# Patient Record
Sex: Female | Born: 1980
Health system: Southern US, Community
[De-identification: ages and names within clinical notes are randomized; demographics above are authoritative.]

## PROBLEM LIST (undated history)

## (undated) DIAGNOSIS — G43909 Migraine, unspecified, not intractable, without status migrainosus: Secondary | ICD-10-CM

## (undated) DIAGNOSIS — F32A Depression, unspecified: Secondary | ICD-10-CM

## (undated) DIAGNOSIS — I10 Essential (primary) hypertension: Secondary | ICD-10-CM

## (undated) DIAGNOSIS — J329 Chronic sinusitis, unspecified: Secondary | ICD-10-CM

## (undated) DIAGNOSIS — K219 Gastro-esophageal reflux disease without esophagitis: Secondary | ICD-10-CM

## (undated) DIAGNOSIS — E7800 Pure hypercholesterolemia, unspecified: Secondary | ICD-10-CM

## (undated) DIAGNOSIS — F419 Anxiety disorder, unspecified: Secondary | ICD-10-CM

## (undated) DIAGNOSIS — F329 Major depressive disorder, single episode, unspecified: Secondary | ICD-10-CM

## (undated) HISTORY — PX: DILATION AND CURETTAGE OF UTERUS: SHX78

## (undated) HISTORY — PX: ADENOIDECTOMY: SUR15

## (undated) HISTORY — DX: Depression, unspecified: F32.A

## (undated) HISTORY — DX: Essential (primary) hypertension: I10

## (undated) HISTORY — DX: Chronic sinusitis, unspecified: J32.9

## (undated) HISTORY — DX: Anxiety disorder, unspecified: F41.9

---
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
## 2002-09-25 ENCOUNTER — Other Ambulatory Visit: Admission: RE | Admit: 2002-09-25 | Discharge: 2002-09-25 | Payer: Self-pay | Admitting: Obstetrics and Gynecology

## 2003-10-02 ENCOUNTER — Other Ambulatory Visit: Admission: RE | Admit: 2003-10-02 | Discharge: 2003-10-02 | Payer: Self-pay | Admitting: Obstetrics and Gynecology

## 2003-11-16 ENCOUNTER — Emergency Department (HOSPITAL_COMMUNITY): Admission: EM | Admit: 2003-11-16 | Discharge: 2003-11-16 | Payer: Self-pay | Admitting: Emergency Medicine

## 2004-05-05 ENCOUNTER — Emergency Department (HOSPITAL_COMMUNITY): Admission: EM | Admit: 2004-05-05 | Discharge: 2004-05-05 | Payer: Self-pay | Admitting: *Deleted

## 2013-08-21 ENCOUNTER — Emergency Department (INDEPENDENT_AMBULATORY_CARE_PROVIDER_SITE_OTHER): Payer: 59

## 2013-08-21 ENCOUNTER — Emergency Department
Admission: EM | Admit: 2013-08-21 | Discharge: 2013-08-21 | Disposition: A | Discharge status: Home / Self Care | Payer: 59 | Source: Home / Self Care | Attending: Family Medicine | Admitting: Family Medicine

## 2013-08-21 ENCOUNTER — Encounter: Payer: Self-pay | Admitting: Emergency Medicine

## 2013-08-21 DIAGNOSIS — J3489 Other specified disorders of nose and nasal sinuses: Secondary | ICD-10-CM

## 2013-08-21 DIAGNOSIS — J069 Acute upper respiratory infection, unspecified: Secondary | ICD-10-CM

## 2013-08-21 HISTORY — DX: Migraine, unspecified, not intractable, without status migrainosus: G43.909

## 2013-08-21 MED ORDER — PREDNISONE 20 MG PO TABS: 20.0000 mg | ORAL_TABLET | Freq: Two times a day (BID) | ORAL | Status: DC

## 2013-08-21 MED ORDER — BENZONATATE 200 MG PO CAPS: 200.0000 mg | ORAL_CAPSULE | Freq: Every day | ORAL | Status: DC

## 2013-08-21 MED ORDER — AMOXICILLIN 875 MG PO TABS: 875.0000 mg | ORAL_TABLET | Freq: Two times a day (BID) | ORAL | Status: DC

## 2013-08-21 NOTE — ED Notes (Signed)
Arwa c/o sinus pain, congestion and productive cough without fever x 8 days. Rec'd flu vac this season.

## 2013-08-21 NOTE — ED Provider Notes (Signed)
CSN: [id number]     Arrival date & time 08/21/13  7564 History   First MD Initiated Contact with Patient 08/21/13 1036     Chief Complaint  Patient presents with  . Sinus Problem  . Cough        HPI Comments: Patient complains of onset of sinus congestion and sore throat 8 days ago, followed by a productive cough worse at night.  She has been fatigued and has developed fullness in her left ear over the past two days. She reports a history of recurring sinus congestion that occurs every month lasting about 2 to 4 days, and has not responded in past to Nasonex. She has a past history of pneumonia several times.  The history is provided by the patient.    Past Medical History  Diagnosis Date  . Migraine    Past Surgical History  Procedure Laterality Date  . Adenoidectomy    . Leg surgery Left 1998    Plastic Surg   Family History  Problem Relation Age of Onset  . Hypertension Father    History  Substance Use Topics  . Smoking status: Never Smoker   . Smokeless tobacco: Never Used  . Alcohol Use: Yes   OB History   Grav Para Term Preterm Abortions TAB SAB Ect Mult Living                 Review of Systems + sore throat + cough + hoarseness No pleuritic pain No wheezing + nasal congestion + post-nasal drainage + sinus pain/pressure No itchy/red eyes ? left earache No hemoptysis No SOB No fever/chills No nausea No vomiting No abdominal pain No diarrhea No urinary symptoms No skin rash + fatigue No myalgias + headache Used OTC meds without relief    Allergies  Review of patient's allergies indicates no known allergies.  Home Medications   Current Outpatient Rx  Name  Route  Sig  Dispense  Refill  . amoxicillin (AMOXIL) 875 MG tablet   Oral   Take 1 tablet (875 mg total) by mouth 2 (two) times daily. (Rx void after 08/29/13)   20 tablet   0   . benzonatate (TESSALON) 200 MG capsule   Oral   Take 1 capsule (200 mg total) by mouth at bedtime. Take  as needed for cough   12 capsule   0   . predniSONE (DELTASONE) 20 MG tablet   Oral   Take 1 tablet (20 mg total) by mouth 2 (two) times daily. Take with food.   10 tablet   0    BP 136/87  Pulse 72  Temp(Src) 98.2 F (36.8 C) (Oral)  Resp 14  Ht 5\' 8"  (1.727 m)  Wt 232 lb (105.235 kg)  BMI 35.28 kg/m2  SpO2 100% Physical Exam Nursing notes and Vital Signs reviewed. Appearance:  Patient appears stated age, and in no acute distress.  Patient is obese (BMI 35.3) Eyes:  Pupils are equal, round, and reactive to light and accomodation.  Extraocular movement is intact.  Conjunctivae are not inflamed  Ears:  Canals normal.  Tympanic membranes normal.  Nose:  Mildly congested turbinates.  No sinus tenderness.   Pharynx:  Normal Neck:  Supple.  Bilateral enlarged posterior nodes, tender on the left. Lungs:  Clear to auscultation.  Breath sounds are equal.  Heart:  Regular rate and rhythm without murmurs, rubs, or gallops.  Abdomen:  Nontender without masses or hepatosplenomegaly.  Bowel sounds are present.  No CVA or  flank tenderness.  Extremities:  No edema.  No calf tenderness Skin:  No rash present.   ED Course  Procedures  none    Imaging Review Dg Sinuses Complete  08/21/2013   CLINICAL DATA:  33 year old female with sinus congestion, increased. Initial encounter.  EXAM: PARANASAL SINUSES - COMPLETE 3 + VIEW  COMPARISON:  None.  FINDINGS: Bone mineralization is within normal limits. Maxillary sinus pneumatization within normal limits. Frontal sinuses likewise appear clear. Ethmoid and sphenoid sinuses appear within normal limits. Symmetric appearing mastoid pneumatization.  IMPRESSION: Negative radiographic sinus series.   Electronically Signed   By: Lars Pinks M.D.   On: 08/21/2013 11:32      MDM   Final diagnoses:  Acute upper respiratory infections of unspecified site; suspect viral URI    There is no evidence of bacterial infection today. Treat symptomatically for  now: Prescription written for Benzonatate [facility]) to take at bedtime for night-time cough.  Prednisone burst. Take plain Mucinex (1200 mg guaifenesin) twice daily for cough and congestion.  May add Sudafed for sinus congestion.   Increase fluid intake, rest. May use Afrin nasal spray (or generic oxymetazoline) twice daily for about 5 days.  Also recommend using saline nasal spray several times daily and saline nasal irrigation (AYR is a common brand) Stop all antihistamines for now, and other non-prescription cough/cold preparations. Begin Amoxicillin if not improving about 5 days or if persistent fever develops (Given a prescription to hold, with an expiration date)  Follow-up with family doctor if not improving 7 to 10 days.     Kandra Nicolas, MD 08/23/13 [phone]

## 2013-08-21 NOTE — Discharge Instructions (Signed)
Take plain Mucinex (1200 mg guaifenesin) twice daily for cough and congestion.  May add Sudafed for sinus congestion.   Increase fluid intake, rest. May use Afrin nasal spray (or generic oxymetazoline) twice daily for about 5 days.  Also recommend using saline nasal spray several times daily and saline nasal irrigation (AYR is a common brand) Stop all antihistamines for now, and other non-prescription cough/cold preparations. Begin Amoxicillin if not improving about 5 days or if persistent fever develops   Follow-up with family doctor if not improving 7 to 10 days.

## 2013-08-27 ENCOUNTER — Telehealth: Payer: Self-pay

## 2013-08-27 NOTE — ED Notes (Signed)
I called and spoke with patient and she is doing some better. I advised to call back if anything changes or if she has questions or concerns.

## 2015-06-30 NOTE — L&D Delivery Note (Signed)
Delivery Note Pt reached complete dilation and pushed great. At 1:55 PM a viable female was delivered via Vaginal, Spontaneous Delivery (Presentation: Left Occiput Anterior).  APGAR: 9, 9; weight pending  .   Placenta status: Intact, Spontaneous.  Cord: 3 vessels with the following complications: None.    Anesthesia: Epidural  Episiotomy: None Lacerations: 2nd degree Suture Repair: 2.0 3.0 vicryl rapide Est. Blood Loss (mL):  225cc  Mom to postpartum.  Baby to Couplet care / Skin to Skin. D/w pt circumcision and they desire to proceed.  Logan Bores 12/31/2015, 2:25 PM

## 2015-07-04 DIAGNOSIS — Z3A13 13 weeks gestation of pregnancy: Secondary | ICD-10-CM | POA: Diagnosis not present

## 2015-07-04 DIAGNOSIS — O358XX0 Maternal care for other (suspected) fetal abnormality and damage, not applicable or unspecified: Secondary | ICD-10-CM | POA: Diagnosis not present

## 2015-07-04 DIAGNOSIS — Z3481 Encounter for supervision of other normal pregnancy, first trimester: Secondary | ICD-10-CM | POA: Diagnosis not present

## 2015-08-05 ENCOUNTER — Inpatient Hospital Stay (HOSPITAL_COMMUNITY)
Admission: AD | Admit: 2015-08-05 | Discharge: 2015-08-05 | Disposition: A | Discharge status: Home / Self Care | Payer: 59 | Source: Ambulatory Visit | Attending: Obstetrics and Gynecology | Admitting: Obstetrics and Gynecology

## 2015-08-05 ENCOUNTER — Encounter (HOSPITAL_COMMUNITY): Payer: Self-pay | Admitting: *Deleted

## 2015-08-05 DIAGNOSIS — O9989 Other specified diseases and conditions complicating pregnancy, childbirth and the puerperium: Secondary | ICD-10-CM

## 2015-08-05 DIAGNOSIS — R0602 Shortness of breath: Secondary | ICD-10-CM

## 2015-08-05 DIAGNOSIS — Z3492 Encounter for supervision of normal pregnancy, unspecified, second trimester: Secondary | ICD-10-CM

## 2015-08-05 DIAGNOSIS — Z3A17 17 weeks gestation of pregnancy: Secondary | ICD-10-CM | POA: Insufficient documentation

## 2015-08-05 DIAGNOSIS — O26892 Other specified pregnancy related conditions, second trimester: Secondary | ICD-10-CM | POA: Diagnosis not present

## 2015-08-05 NOTE — MAU Provider Note (Signed)
History     CSN: [id number]  Arrival date and time: 08/05/15 1318   None     Chief Complaint  Patient presents with  . Shortness of Breath   HPI Mackenzie Holland is 35 y.o. [id number] 17w gestation presenting with 4 day history of shortness of breath.  She is a patient of Dr. Nena Alexander.  Began Friday at work, felt "winded"-busy day, moving transport beds.  Took Benadryl and rested when she got home --unable to sleep well.  Saturday was the same but yesterday afternoon after going to [employer], lower back hurt-"like I needed to stretch out by back"  Then she was unable to take a deep breath, needing to take a deep yawn to get air into lungs.  Decreased appetite.  Last night rested propped up all night, unable to rest.  Even sheet on her was "too heavy" on her chest.  Called office today and instructed to come here.  She reports heart palpitations during the pregnancy with exertion, reported in the past to Dr. Marvel Plan.           Past Medical History  Diagnosis Date  . Migraine     Past Surgical History  Procedure Laterality Date  . Adenoidectomy    . Leg surgery Left 1998    Plastic Surg  . Dilation and curettage of uterus      Family History  Problem Relation Age of Onset  . Hypertension Father     Social History  Substance Use Topics  . Smoking status: Never Smoker   . Smokeless tobacco: Never Used  . Alcohol Use: Yes    Allergies: No Known Allergies  Prescriptions prior to admission  Medication Sig Dispense Refill Last Dose  . amoxicillin (AMOXIL) 875 MG tablet Take 1 tablet (875 mg total) by mouth 2 (two) times daily. (Rx void after 08/29/13) 20 tablet 0   . benzonatate (TESSALON) 200 MG capsule Take 1 capsule (200 mg total) by mouth at bedtime. Take as needed for cough 12 capsule 0   . predniSONE (DELTASONE) 20 MG tablet Take 1 tablet (20 mg total) by mouth 2 (two) times daily. Take with food. 10 tablet 0     Review of Systems  Constitutional: Negative for  fever and chills.  HENT:       Nosebleeds daily that began at [redacted] weeks gestation.  Respiratory: Positive for shortness of breath. Negative for cough (today, took tylenol today, better), sputum production and wheezing.   Cardiovascular: Positive for palpitations (intermittent with activity) and orthopnea. Negative for chest pain and leg swelling.  Gastrointestinal: Positive for nausea (intermittent throughout pregnancy). Negative for vomiting, abdominal pain, diarrhea and constipation.  Genitourinary: Negative for dysuria, urgency, frequency and hematuria.       Neg for vaginal bleeding.  Neurological: Positive for headaches. Negative for weakness.   Physical Exam   Blood pressure 150/78, pulse 73, temperature 98 F (36.7 C), temperature source Oral, resp. rate 18.  Physical Exam  Constitutional: She is oriented to person, place, and time. She appears well-developed and well-nourished. No distress.  HENT:  Head: Normocephalic.  Neck: Normal range of motion. Neck supple.  Cardiovascular: Normal rate, regular rhythm and normal heart sounds.   Respiratory: Effort normal and breath sounds normal. No respiratory distress. She has no wheezes. She has no rales. She exhibits no tenderness.  GI: Soft. There is no tenderness.  Genitourinary: No bleeding in the vagina.   not indicated  Musculoskeletal: Normal range of motion. She  exhibits no edema.  Neurological: She is alert and oriented to person, place, and time.  Skin: Skin is warm and dry.  Psychiatric: She has a normal mood and affect. Her behavior is normal. Thought content normal.   Fetal heart rate 142   MAU Course  Procedures  MDM MSE Exam EKG- NSR, borderline ECG-nod voltage criteria for LVH, may be normal variant.           Reported to Dr. Marvel Plan -may discharge patient  14:05 Discussed patient's hx, MSE, physical findings and VS with Dr. Marvel Plan.  Order given for EKG.  O2 Sats 100% at discharge.   Assessment and Plan   A:  Shortness of breath      [redacted] weeks gestation  P:  Discharge to home      Call Dr. Marvel Plan for increase in sxs      Keep scheduled appts in the office   KEY,EVE M 08/05/2015, 1:42 PM

## 2015-08-05 NOTE — Discharge Instructions (Signed)

## 2015-08-05 NOTE — MAU Note (Signed)
C/o SOB since Friday (4 days ago);

## 2015-08-15 ENCOUNTER — Ambulatory Visit: Payer: 59 | Admitting: Physician Assistant

## 2015-08-20 ENCOUNTER — Emergency Department (INDEPENDENT_AMBULATORY_CARE_PROVIDER_SITE_OTHER)
Admission: EM | Admit: 2015-08-20 | Discharge: 2015-08-20 | Disposition: A | Discharge status: Home / Self Care | Payer: 59 | Source: Home / Self Care | Attending: Family Medicine | Admitting: Family Medicine

## 2015-08-20 ENCOUNTER — Encounter: Payer: Self-pay | Admitting: *Deleted

## 2015-08-20 DIAGNOSIS — R059 Cough, unspecified: Secondary | ICD-10-CM

## 2015-08-20 DIAGNOSIS — R05 Cough: Secondary | ICD-10-CM

## 2015-08-20 DIAGNOSIS — Z0372 Encounter for suspected placental problem ruled out: Secondary | ICD-10-CM | POA: Diagnosis not present

## 2015-08-20 DIAGNOSIS — J069 Acute upper respiratory infection, unspecified: Secondary | ICD-10-CM | POA: Diagnosis not present

## 2015-08-20 DIAGNOSIS — Z36 Encounter for antenatal screening of mother: Secondary | ICD-10-CM | POA: Diagnosis not present

## 2015-08-20 DIAGNOSIS — Z3A19 19 weeks gestation of pregnancy: Secondary | ICD-10-CM | POA: Diagnosis not present

## 2015-08-20 MED ORDER — AZITHROMYCIN 250 MG PO TABS: 250.0000 mg | ORAL_TABLET | Freq: Every day | ORAL | Status: DC

## 2015-08-20 NOTE — ED Notes (Signed)
Pt c/o cough, productive at times x 4 days. Started Tamiflu 5 days ago for flu-like symptoms, written by OB. Currently [redacted]W[redacted]d gestation. Taking Delsym for daytime but cannot sleep at night due to cough. Afebrile.

## 2015-08-20 NOTE — Discharge Instructions (Signed)
Cool Mist Vaporizers  Vaporizers may help relieve the symptoms of a cough and cold. They add moisture to the air, which helps mucus to become thinner and less sticky. This makes it easier to breathe and cough up secretions. Cool mist vaporizers do not cause serious burns like hot mist vaporizers, which may also be called steamers or humidifiers. Vaporizers have not been proven to help with colds. You should not use a vaporizer if you are allergic to mold.  HOME CARE INSTRUCTIONS  · Follow the package instructions for the vaporizer.  · Do not use anything other than distilled water in the vaporizer.  · Do not run the vaporizer all of the time. This can cause mold or bacteria to grow in the vaporizer.  · Clean the vaporizer after each time it is used.  · Clean and dry the vaporizer well before storing it.  · Stop using the vaporizer if worsening respiratory symptoms develop.     This information is not intended to replace advice given to you by your health care provider. Make sure you discuss any questions you have with your health care provider.     Document Released: 03/12/2004 Document Revised: 06/20/2013 Document Reviewed: 11/02/2012  Elsevier Interactive Patient Education ©2016 [employer] Inc[employer]

## 2015-08-20 NOTE — ED Provider Notes (Signed)
CSN: [id number]     Arrival date & time 08/20/15  1604 History   First MD Initiated Contact with Patient 08/20/15 1652     Chief Complaint  Patient presents with  . Cough   (Consider location/radiation/quality/duration/timing/severity/associated sxs/prior Treatment) HPI  The pt is a 35yo female, 19 weeks 6 days pregnant, presenting to [facility] with c/o 4 days of mild to moderately productive cough that is worse at night making it difficult for pt to sleep.  She was seen by her OB/GYN initially and started on a 5 day course of Tamiflu. She has been taking as prescribed as well as Delsym but no relief of cough. Her OB/GYN referred her to a "general practitioner" for further evaluation.  Denies hx of asthma. Denies fever, chills, n/v/d. She did have a fever initially but that has resolved as well as body aches.   Past Medical History  Diagnosis Date  . Migraine    Past Surgical History  Procedure Laterality Date  . Adenoidectomy    . Leg surgery Left 1998    Plastic Surg  . Dilation and curettage of uterus     Family History  Problem Relation Age of Onset  . Hypertension Father    Social History  Substance Use Topics  . Smoking status: Never Smoker   . Smokeless tobacco: Never Used  . Alcohol Use: No   OB History    Gravida Para Term Preterm AB TAB SAB Ectopic Multiple Living   3 2 2       2      Review of Systems  Constitutional: Negative for fever and chills.  HENT: Positive for congestion. Negative for ear pain, sore throat, trouble swallowing and voice change.   Respiratory: Positive for cough. Negative for shortness of breath.   Cardiovascular: Negative for chest pain and palpitations.  Gastrointestinal: Negative for nausea, vomiting, abdominal pain and diarrhea.  Musculoskeletal: Negative for myalgias, back pain and arthralgias.  Skin: Negative for rash.    Allergies  Review of patient's allergies indicates no known allergies.  Home Medications   Prior to Admission  medications   Medication Sig Start Date End Date Taking? Authorizing Provider  Oseltamivir Phosphate (TAMIFLU PO) Take by mouth.   Yes Historical Provider, MD  acetaminophen (TYLENOL) 500 MG tablet Take 500 mg by mouth every 6 (six) hours as needed for moderate pain.    Historical Provider, MD  azithromycin (ZITHROMAX) 250 MG tablet Take 1 tablet (250 mg total) by mouth daily. Take first 2 tablets together, then 1 every day until finished. 08/20/15   Noland Fordyce, PA-C  famotidine (PEPCID) 20 MG tablet Take 20 mg by mouth 2 (two) times daily.    Historical Provider, MD  Prenatal Vit-Fe Fumarate-FA (PRENATAL MULTIVITAMIN) TABS tablet Take 1 tablet by mouth daily at 12 noon.    Historical Provider, MD   Meds Ordered and Administered this Visit  Medications - No data to display  BP 145/88 mmHg  Pulse 85  Temp(Src) 98.2 F (36.8 C) (Oral)  Resp 14  Wt 231 lb (104.781 kg)  SpO2 100% No data found.   Physical Exam  Constitutional: She appears well-developed and well-nourished. No distress.  HENT:  Head: Normocephalic and atraumatic.  Right Ear: Tympanic membrane normal.  Left Ear: Tympanic membrane normal.  Nose: Rhinorrhea present.  Mouth/Throat: Uvula is midline, oropharynx is clear and moist and mucous membranes are normal.  Eyes: Conjunctivae are normal. No scleral icterus.  Neck: Normal range of motion. Neck supple.  Cardiovascular: Normal rate, regular rhythm and normal heart sounds.   Pulmonary/Chest: Effort normal and breath sounds normal. No respiratory distress. She has no wheezes. She has no rales. She exhibits no tenderness.  Abdominal: Soft. She exhibits no distension. There is no tenderness.  Musculoskeletal: Normal range of motion.  Neurological: She is alert.  Skin: Skin is warm and dry. She is not diaphoretic.  Nursing note and vitals reviewed.   ED Course  Procedures (including critical care time)  Labs Review Labs Reviewed - No data to display  Imaging  Review No results found.    MDM   1. Acute upper respiratory infection   2. Cough    Pt c/o persistent cough that keeps her up at night. Fatigue, body aches and fever she initially had have resolved after she started taking Tamiflu prescribed by her OB/GYN.  Will place pt on Azithromycin to cover for atypical bacteria.   Encouraged to use humidifier at night, stay well hydrated and try sinus rinses for cough as Tessalon and cough medication with codeine is pregnancy risk category 'C' Advised pt she may discuss stronger prescription cough medication with her OB/GYN now that is has been seen for cough and started on antibiotics.  Patient verbalized understanding and agreement with treatment plan.    Noland Fordyce, PA-C 08/20/15 1739

## 2015-09-19 DIAGNOSIS — Z3A24 24 weeks gestation of pregnancy: Secondary | ICD-10-CM | POA: Diagnosis not present

## 2015-09-19 DIAGNOSIS — Z3482 Encounter for supervision of other normal pregnancy, second trimester: Secondary | ICD-10-CM | POA: Diagnosis not present

## 2015-09-26 ENCOUNTER — Ambulatory Visit (INDEPENDENT_AMBULATORY_CARE_PROVIDER_SITE_OTHER): Payer: 59 | Admitting: Cardiology

## 2015-09-26 ENCOUNTER — Other Ambulatory Visit (HOSPITAL_COMMUNITY): Payer: 59

## 2015-09-26 ENCOUNTER — Encounter: Payer: Self-pay | Admitting: Cardiology

## 2015-09-26 VITALS — BP 122/84 | HR 80 | Ht 68.0 in | Wt 237.4 lb

## 2015-09-26 DIAGNOSIS — R0602 Shortness of breath: Secondary | ICD-10-CM | POA: Diagnosis not present

## 2015-09-26 DIAGNOSIS — R0600 Dyspnea, unspecified: Secondary | ICD-10-CM | POA: Diagnosis not present

## 2015-09-26 NOTE — Progress Notes (Signed)
Cardiology Office Note NEW PATIENT  Date:  09/26/2015   ID:  IFFANY MCADAM, DOB 1981/02/14, MRN [id number]  PCP:  No primary care provider on file.  Cardiologist:  NEW  Dr. Acie Fredrickson    Chief Complaint  Patient presents with  . Shortness of Breath    wakes from sleep at night, + skipped heart beats, DOE.       History of Present Illness: TERENCE QUASHIE is a 35 y.o. female who presents for SOB that wakes her from sleep.  Also skipped beats, and DOE.  She is 25 weeks 1 day preg. 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   Symptoms began in Feb.  She felt tight all over and was SOB at rest.  This continued for 2 full days so went to MAU clinic at [facility] and was found stable.  She then developed the Flu and then URI.  She is now better overall with some SOB left.  She is unable to take stairs at work due to SOB at home ok walking up one flight of stairs.   She is resting better as well.  She has only gained 4 lbs this pregnancy.  She did have a lot of nausea initially.  This is a boy and other 2 are girls.  No chest pain.    Past Medical History  Diagnosis Date  . Migraine     Past Surgical History  Procedure Laterality Date  . Adenoidectomy    . Leg surgery Left 1998    Plastic Surg  . Dilation and curettage of uterus       Current Outpatient Prescriptions  Medication Sig Dispense Refill  . acetaminophen (TYLENOL) 500 MG tablet Take 500 mg by mouth every 6 (six) hours as needed for moderate pain.    . famotidine (PEPCID) 20 MG tablet Take 20 mg by mouth 2 (two) times daily.    . Prenatal Vit-Fe Fumarate-FA (PRENATAL MULTIVITAMIN) TABS tablet Take 1 tablet by mouth daily at 12 noon.     No current facility-administered medications for this visit.    Allergies:   Review of patient's allergies indicates no known allergies.    Social History:  The patient  reports that she has never smoked. She has never used smokeless tobacco. She reports that she does not drink alcohol or use  illicit drugs.   Family History:  The patient's family history includes Hypertension in her father.    ROS:  General:no colds or fevers, 4 lb weight gain Skin:no rashes or ulcers HEENT:no blurred vision, no congestion CV:see HPI PUL:see HPI GI:no diarrhea constipation or melena, no indigestion GU:no hematuria, no dysuria MS:no joint pain, no claudication Neuro:no syncope, no lightheadedness Endo:no diabetes, no thyroid disease  Wt Readings from Last 3 Encounters:  09/26/15 237 lb 6.4 oz (107.684 kg)  08/20/15 231 lb (104.781 kg)  08/21/13 232 lb (105.235 kg)     PHYSICAL EXAM: VS:  BP 122/84 mmHg  Pulse 80  Ht 5\' 8"  (1.727 m)  Wt 237 lb 6.4 oz (107.684 kg)  BMI 36.10 kg/m2 , BMI Body mass index is 36.1 kg/(m^2). General:Pleasant affect, NAD Skin:Warm and dry, brisk capillary refill HEENT:normocephalic, sclera clear, mucus membranes moist Neck:supple, no JVD, no bruits  Heart:S1S2 RRR with soft flow murmur, no gallup, rub or click Lungs:clear without rales, rhonchi, or wheezes [id number], non tender, + BS, do not palpate liver spleen or masses Ext:no lower ext edema, 2+ pedal pulses, 2+ radial pulses Neuro:alert and  oriented X 3, MAE, follows commands, + facial symmetry    EKG:  EKG is ordered today. The ekg ordered today demonstrates SR mod LVH. No changes from previous tracings.    Recent Labs: No results found for requested labs within last 365 days.    Lipid Panel No results found for: CHOL, TRIG, HDL, CHOLHDL, VLDL, LDLCALC, LDLDIRECT     Other studies Reviewed: Additional studies/ records that were reviewed today include: office notes from OB.   ASSESSMENT AND PLAN:  1.  SOB- Dr. Acie Fredrickson has seen also.  Will check an Echo to evaluate but he felt this is due to volume.  She was reassured but she will call if further problems.  We will follow PRN.    Current medicines are reviewed with the patient today.  The patient Has no concerns regarding  medicines.  The following changes have been made:  See above Labs/ tests ordered today include:see above  Disposition:   FU:  see above  Signed, Isaiah Serge, NP  09/26/2015 1:39 PM    [facility] [place], [place], [address] [place], [place] Phone: [phone]; Fax: [phone]  Attending Note:   The patient was seen and examined.  Agree with assessment and plan as noted above.  Changes made to the above note as needed.  Kimley is seen today .   C/o dyspnea but certainly is in no distress today . No evidence that this is Pulmonary embolus I suspect it is related to volume expansion with pregnancy. Will get an echo and follow up with me as needed.    Thayer Headings, Brooke Bonito., MD, [facility] 09/26/2015, 6:17 PM [address],  [place] Pager [phone]

## 2015-09-26 NOTE — Patient Instructions (Signed)
Medication Instructions:  Your physician recommends that you continue on your current medications as directed. Please refer to the Current Medication list given to you today.   Labwork: -NONE  Testing/Procedures: Your physician has requested that you have an echocardiogram. Echocardiography is a painless test that uses sound waves to create images of your heart. It provides your doctor with information about the size and shape of your heart and how well your heart's chambers and valves are working. This procedure takes approximately one hour. There are no restrictions for this procedure.    Follow-Up: AS NEEDED  Any Other Special Instructions Will Be Listed Below (If Applicable).     If you need a refill on your cardiac medications before your next appointment, please call your pharmacy.

## 2015-10-15 ENCOUNTER — Ambulatory Visit (HOSPITAL_COMMUNITY): Payer: 59 | Attending: Internal Medicine

## 2015-10-15 ENCOUNTER — Other Ambulatory Visit: Payer: Self-pay

## 2015-10-15 DIAGNOSIS — R0600 Dyspnea, unspecified: Secondary | ICD-10-CM | POA: Insufficient documentation

## 2015-10-15 DIAGNOSIS — I071 Rheumatic tricuspid insufficiency: Secondary | ICD-10-CM | POA: Insufficient documentation

## 2015-10-18 DIAGNOSIS — Z3483 Encounter for supervision of other normal pregnancy, third trimester: Secondary | ICD-10-CM | POA: Diagnosis not present

## 2015-10-18 DIAGNOSIS — Z23 Encounter for immunization: Secondary | ICD-10-CM | POA: Diagnosis not present

## 2015-10-18 DIAGNOSIS — Z36 Encounter for antenatal screening of mother: Secondary | ICD-10-CM | POA: Diagnosis not present

## 2015-10-18 DIAGNOSIS — Z3A28 28 weeks gestation of pregnancy: Secondary | ICD-10-CM | POA: Diagnosis not present

## 2015-10-31 DIAGNOSIS — Z3483 Encounter for supervision of other normal pregnancy, third trimester: Secondary | ICD-10-CM | POA: Diagnosis not present

## 2015-10-31 DIAGNOSIS — Z3A30 30 weeks gestation of pregnancy: Secondary | ICD-10-CM | POA: Diagnosis not present

## 2015-12-03 DIAGNOSIS — O289 Unspecified abnormal findings on antenatal screening of mother: Secondary | ICD-10-CM | POA: Diagnosis not present

## 2015-12-03 DIAGNOSIS — Z3A34 34 weeks gestation of pregnancy: Secondary | ICD-10-CM | POA: Diagnosis not present

## 2015-12-03 DIAGNOSIS — O3663X0 Maternal care for excessive fetal growth, third trimester, not applicable or unspecified: Secondary | ICD-10-CM | POA: Diagnosis not present

## 2015-12-11 DIAGNOSIS — Z36 Encounter for antenatal screening of mother: Secondary | ICD-10-CM | POA: Diagnosis not present

## 2015-12-11 DIAGNOSIS — Z3A36 36 weeks gestation of pregnancy: Secondary | ICD-10-CM | POA: Diagnosis not present

## 2015-12-11 DIAGNOSIS — O403XX0 Polyhydramnios, third trimester, not applicable or unspecified: Secondary | ICD-10-CM | POA: Diagnosis not present

## 2015-12-13 DIAGNOSIS — Z3A36 36 weeks gestation of pregnancy: Secondary | ICD-10-CM | POA: Diagnosis not present

## 2015-12-13 DIAGNOSIS — O403XX0 Polyhydramnios, third trimester, not applicable or unspecified: Secondary | ICD-10-CM | POA: Diagnosis not present

## 2015-12-17 DIAGNOSIS — Z3A36 36 weeks gestation of pregnancy: Secondary | ICD-10-CM | POA: Diagnosis not present

## 2015-12-17 DIAGNOSIS — O403XX0 Polyhydramnios, third trimester, not applicable or unspecified: Secondary | ICD-10-CM | POA: Diagnosis not present

## 2015-12-17 DIAGNOSIS — O139 Gestational [pregnancy-induced] hypertension without significant proteinuria, unspecified trimester: Secondary | ICD-10-CM | POA: Diagnosis not present

## 2015-12-20 DIAGNOSIS — O403XX0 Polyhydramnios, third trimester, not applicable or unspecified: Secondary | ICD-10-CM | POA: Diagnosis not present

## 2015-12-20 DIAGNOSIS — Z3A37 37 weeks gestation of pregnancy: Secondary | ICD-10-CM | POA: Diagnosis not present

## 2015-12-24 DIAGNOSIS — Z3A37 37 weeks gestation of pregnancy: Secondary | ICD-10-CM | POA: Diagnosis not present

## 2015-12-24 DIAGNOSIS — O403XX0 Polyhydramnios, third trimester, not applicable or unspecified: Secondary | ICD-10-CM | POA: Diagnosis not present

## 2015-12-27 ENCOUNTER — Inpatient Hospital Stay (HOSPITAL_COMMUNITY)
Admission: AD | Admit: 2015-12-27 | Discharge: 2015-12-27 | Disposition: A | Discharge status: Home / Self Care | Payer: 59 | Source: Ambulatory Visit | Attending: Obstetrics and Gynecology | Admitting: Obstetrics and Gynecology

## 2015-12-27 ENCOUNTER — Encounter (HOSPITAL_COMMUNITY): Payer: Self-pay | Admitting: *Deleted

## 2015-12-27 DIAGNOSIS — O26893 Other specified pregnancy related conditions, third trimester: Secondary | ICD-10-CM | POA: Insufficient documentation

## 2015-12-27 DIAGNOSIS — R030 Elevated blood-pressure reading, without diagnosis of hypertension: Secondary | ICD-10-CM | POA: Insufficient documentation

## 2015-12-27 DIAGNOSIS — Z3A38 38 weeks gestation of pregnancy: Secondary | ICD-10-CM | POA: Diagnosis not present

## 2015-12-27 DIAGNOSIS — O403XX0 Polyhydramnios, third trimester, not applicable or unspecified: Secondary | ICD-10-CM | POA: Diagnosis not present

## 2015-12-27 HISTORY — DX: Gastro-esophageal reflux disease without esophagitis: K21.9

## 2015-12-27 LAB — COMPREHENSIVE METABOLIC PANEL
ALT: 22 U/L (ref 14–54)
AST: 21 U/L (ref 15–41)
Albumin: 3.4 g/dL — ABNORMAL LOW (ref 3.5–5.0)
Alkaline Phosphatase: 114 U/L (ref 38–126)
Anion gap: 9 (ref 5–15)
BUN: 6 mg/dL (ref 6–20)
CO2: 24 mmol/L (ref 22–32)
Calcium: 9.2 mg/dL (ref 8.9–10.3)
Chloride: 102 mmol/L (ref 101–111)
Creatinine, Ser: 0.83 mg/dL (ref 0.44–1.00)
GFR calc Af Amer: >60 mL/min (ref >60)
GFR calc non Af Amer: >60 mL/min (ref >60)
Glucose, Bld: 102 mg/dL — ABNORMAL HIGH (ref 65–99)
Potassium: 3.4 mmol/L — ABNORMAL LOW (ref 3.5–5.1)
Sodium: 135 mmol/L (ref 135–145)
Total Bilirubin: 0.5 mg/dL (ref 0.3–1.2)
Total Protein: 7.7 g/dL (ref 6.5–8.1)

## 2015-12-27 LAB — PROTEIN / CREATININE RATIO, URINE
Creatinine, Urine: 152 mg/dL
Protein Creatinine Ratio: 0.18 mg/mg{Cre} — ABNORMAL HIGH (ref 0.00–0.15)
Total Protein, Urine: 27 mg/dL

## 2015-12-27 LAB — CBC
HCT: 38.8 % (ref 36.0–46.0)
Hemoglobin: 12.9 g/dL (ref 12.0–15.0)
MCH: 27.9 pg (ref 26.0–34.0)
MCHC: 33.2 g/dL (ref 30.0–36.0)
MCV: 84 fL (ref 78.0–100.0)
Platelets: 218 10*3/uL (ref 150–400)
RBC: 4.62 MIL/uL (ref 3.87–5.11)
RDW: 14.2 % (ref 11.5–15.5)
WBC: 17.7 10*3/uL — ABNORMAL HIGH (ref 4.0–10.5)

## 2015-12-27 NOTE — Discharge Instructions (Signed)
Braxton Hicks Contractions °Contractions of the uterus can occur throughout pregnancy. Contractions are not always a sign that you are in labor.  °WHAT ARE BRAXTON HICKS CONTRACTIONS?  °Contractions that occur before labor are called Braxton Hicks contractions, or false labor. Toward the end of pregnancy (32-34 weeks), these contractions can develop more often and may become more forceful. This is not true labor because these contractions do not result in opening (dilatation) and thinning of the cervix. They are sometimes difficult to tell apart from true labor because these contractions can be forceful and people have different pain tolerances. You should not feel embarrassed if you go to the hospital with false labor. Sometimes, the only way to tell if you are in true labor is for your health care provider to look for changes in the cervix. °If there are no prenatal problems or other health problems associated with the pregnancy, it is completely safe to be sent home with false labor and await the onset of true labor. °HOW CAN YOU TELL THE DIFFERENCE BETWEEN TRUE AND FALSE LABOR? °False Labor °· The contractions of false labor are usually shorter and not as hard as those of true labor.   °· The contractions are usually irregular.   °· The contractions are often felt in the front of the lower abdomen and in the groin.   °· The contractions may go away when you walk around or change positions while lying down.   °· The contractions get weaker and are shorter lasting as time goes on.   °· The contractions do not usually become progressively stronger, regular, and closer together as with true labor.   °True Labor °· Contractions in true labor last 30-70 seconds, become very regular, usually become more intense, and increase in frequency.   °· The contractions do not go away with walking.   °· The discomfort is usually felt in the top of the uterus and spreads to the lower abdomen and low back.   °· True labor can be  determined by your health care provider with an exam. This will show that the cervix is dilating and getting thinner.   °WHAT TO REMEMBER °· Keep up with your usual exercises and follow other instructions given by your health care provider.   °· Take medicines as directed by your health care provider.   °· Keep your regular prenatal appointments.   °· Eat and drink lightly if you think you are going into labor.   °· If Braxton Hicks contractions are making you uncomfortable:   °¨ Change your position from lying down or resting to walking, or from walking to resting.   °¨ Sit and rest in a tub of warm water.   °¨ Drink 2-3 glasses of water. Dehydration may cause these contractions.   °¨ Do slow and deep breathing several times an hour.   °WHEN SHOULD I SEEK IMMEDIATE MEDICAL CARE? °Seek immediate medical care if: °· Your contractions become stronger, more regular, and closer together.   °· You have fluid leaking or gushing from your vagina.   °· You have a fever.   °· You pass blood-tinged mucus.   °· You have vaginal bleeding.   °· You have continuous abdominal pain.   °· You have low back pain that you never had before.   °· You feel your baby's head pushing down and causing pelvic pressure.   °· Your baby is not moving as much as it used to.   °  °This information is not intended to replace advice given to you by your health care provider. Make sure you discuss any questions you have with your health care   provider. °  °Document Released: 06/15/2005 Document Revised: 06/20/2013 Document Reviewed: 03/27/2013 °Elsevier Interactive Patient Education ©2016 [employer] ° °

## 2015-12-27 NOTE — MAU Provider Note (Signed)
Pt is [redacted]w[redacted]d pregnant G3P2002 who was sent from the office dilated 5 cm and BP elevated. Pt is here for pre-eclampsia labs. Pt is stable without headaches, nausea, vomiting, vision changes.  Pt has some edema in hands and feet.   Pt having ctx every 5 to 6 minutes Baseline 135 bpm with variability 6-25 BPM with 15x15 accelerations and no decelerations. Pt turned over to RN for labor evaluation and report to Dr. Willis Modena. [place], NP

## 2015-12-27 NOTE — MAU Note (Signed)
Sent from the office, contracting; 5-6 cm

## 2015-12-30 DIAGNOSIS — O403XX0 Polyhydramnios, third trimester, not applicable or unspecified: Secondary | ICD-10-CM | POA: Diagnosis not present

## 2015-12-30 DIAGNOSIS — Z3A38 38 weeks gestation of pregnancy: Secondary | ICD-10-CM | POA: Diagnosis not present

## 2015-12-30 DIAGNOSIS — O133 Gestational [pregnancy-induced] hypertension without significant proteinuria, third trimester: Secondary | ICD-10-CM | POA: Diagnosis not present

## 2015-12-31 ENCOUNTER — Inpatient Hospital Stay (HOSPITAL_COMMUNITY)
Admission: AD | Admit: 2015-12-31 | Discharge: 2016-01-02 | DRG: 775 | Disposition: A | Discharge status: Home / Self Care | Payer: 59 | Source: Ambulatory Visit | Attending: Obstetrics and Gynecology | Admitting: Obstetrics and Gynecology

## 2015-12-31 ENCOUNTER — Inpatient Hospital Stay: Admit: 2015-12-31 | Payer: Self-pay | Admitting: Obstetrics and Gynecology

## 2015-12-31 ENCOUNTER — Inpatient Hospital Stay (HOSPITAL_COMMUNITY): Payer: 59 | Admitting: Anesthesiology

## 2015-12-31 ENCOUNTER — Encounter (HOSPITAL_COMMUNITY): Payer: Self-pay

## 2015-12-31 DIAGNOSIS — O9962 Diseases of the digestive system complicating childbirth: Secondary | ICD-10-CM | POA: Diagnosis present

## 2015-12-31 DIAGNOSIS — Z6837 Body mass index (BMI) 37.0-37.9, adult: Secondary | ICD-10-CM

## 2015-12-31 DIAGNOSIS — E669 Obesity, unspecified: Secondary | ICD-10-CM | POA: Diagnosis present

## 2015-12-31 DIAGNOSIS — Z8249 Family history of ischemic heart disease and other diseases of the circulatory system: Secondary | ICD-10-CM

## 2015-12-31 DIAGNOSIS — K219 Gastro-esophageal reflux disease without esophagitis: Secondary | ICD-10-CM | POA: Diagnosis present

## 2015-12-31 DIAGNOSIS — O134 Gestational [pregnancy-induced] hypertension without significant proteinuria, complicating childbirth: Secondary | ICD-10-CM | POA: Diagnosis present

## 2015-12-31 DIAGNOSIS — Z3A38 38 weeks gestation of pregnancy: Secondary | ICD-10-CM | POA: Diagnosis not present

## 2015-12-31 DIAGNOSIS — Z87891 Personal history of nicotine dependence: Secondary | ICD-10-CM

## 2015-12-31 DIAGNOSIS — O403XX0 Polyhydramnios, third trimester, not applicable or unspecified: Secondary | ICD-10-CM | POA: Diagnosis present

## 2015-12-31 DIAGNOSIS — O99214 Obesity complicating childbirth: Secondary | ICD-10-CM | POA: Diagnosis present

## 2015-12-31 LAB — CBC
HCT: 36.4 % (ref 36.0–46.0)
HEMOGLOBIN: 12.3 g/dL (ref 12.0–15.0)
MCH: 28.1 pg (ref 26.0–34.0)
MCHC: 33.8 g/dL (ref 30.0–36.0)
MCV: 83.3 fL (ref 78.0–100.0)
Platelets: 206 10*3/uL (ref 150–400)
RBC: 4.37 MIL/uL (ref 3.87–5.11)
RDW: 14.1 % (ref 11.5–15.5)
WBC: 16.6 10*3/uL — ABNORMAL HIGH (ref 4.0–10.5)

## 2015-12-31 LAB — COMPREHENSIVE METABOLIC PANEL
ALBUMIN: 3 g/dL — AB (ref 3.5–5.0)
ALK PHOS: 127 U/L — AB (ref 38–126)
ALT: 20 U/L (ref 14–54)
AST: 28 U/L (ref 15–41)
Anion gap: 9 (ref 5–15)
BILIRUBIN TOTAL: 1.1 mg/dL (ref 0.3–1.2)
BUN: 9 mg/dL (ref 6–20)
CALCIUM: 8.9 mg/dL (ref 8.9–10.3)
CO2: 21 mmol/L — AB (ref 22–32)
CREATININE: 0.72 mg/dL (ref 0.44–1.00)
Chloride: 103 mmol/L (ref 101–111)
GFR calc Af Amer: >60 mL/min (ref >60)
GFR calc non Af Amer: >60 mL/min (ref >60)
GLUCOSE: 83 mg/dL (ref 65–99)
Potassium: 4.5 mmol/L (ref 3.5–5.1)
SODIUM: 133 mmol/L — AB (ref 135–145)
TOTAL PROTEIN: 6.4 g/dL — AB (ref 6.5–8.1)

## 2015-12-31 LAB — TYPE AND SCREEN
ABO/RH(D): A POS
Antibody Screen: NEGATIVE

## 2015-12-31 LAB — ABO/RH: ABO/RH(D): A POS

## 2015-12-31 LAB — OB RESULTS CONSOLE GBS: GBS: NEGATIVE

## 2015-12-31 MED ORDER — OXYCODONE HCL 5 MG PO TABS
10.0000 mg | ORAL_TABLET | ORAL | Status: DC | PRN
Start: 2015-12-31 — End: 2016-01-02

## 2015-12-31 MED ORDER — LACTATED RINGERS IV SOLN: 500.0000 mL | INTRAVENOUS | Status: DC | PRN

## 2015-12-31 MED ORDER — EPHEDRINE 5 MG/ML INJ
10.0000 mg | INTRAVENOUS | Status: DC | PRN
  Filled 2015-12-31: qty 2

## 2015-12-31 MED ORDER — LIDOCAINE HCL (PF) 1 % IJ SOLN
30.0000 mL | INTRAMUSCULAR | Status: DC | PRN
  Filled 2015-12-31: qty 30

## 2015-12-31 MED ORDER — OXYTOCIN 40 UNITS IN LACTATED RINGERS INFUSION - SIMPLE MED: 2.5000 [IU]/h | INTRAVENOUS | Status: DC

## 2015-12-31 MED ORDER — IBUPROFEN 600 MG PO TABS
600.0000 mg | ORAL_TABLET | Freq: Four times a day (QID) | ORAL | Status: DC
  Administered 2015-12-31 – 2016-01-02 (×8): 600 mg via ORAL
  Filled 2015-12-31 (×8): qty 1

## 2015-12-31 MED ORDER — ONDANSETRON HCL 4 MG/2ML IJ SOLN: 4.0000 mg | INTRAMUSCULAR | Status: DC | PRN

## 2015-12-31 MED ORDER — OXYTOCIN 40 UNITS IN LACTATED RINGERS INFUSION - SIMPLE MED
1.0000 m[IU]/min | INTRAVENOUS | Status: DC
  Administered 2015-12-31: 10 m[IU]/min via INTRAVENOUS
  Administered 2015-12-31: 2 m[IU]/min via INTRAVENOUS
  Administered 2015-12-31: 8 m[IU]/min via INTRAVENOUS
  Filled 2015-12-31: qty 1000

## 2015-12-31 MED ORDER — ACETAMINOPHEN 325 MG PO TABS
650.0000 mg | ORAL_TABLET | ORAL | Status: DC | PRN
Start: 2015-12-31 — End: 2016-01-02
  Administered 2016-01-01: 650 mg via ORAL
  Filled 2015-12-31: qty 2

## 2015-12-31 MED ORDER — WITCH HAZEL-GLYCERIN EX PADS: 1.0000 "application " | MEDICATED_PAD | CUTANEOUS | Status: DC | PRN

## 2015-12-31 MED ORDER — ONDANSETRON HCL 4 MG PO TABS: 4.0000 mg | ORAL_TABLET | ORAL | Status: DC | PRN

## 2015-12-31 MED ORDER — DIPHENHYDRAMINE HCL 50 MG/ML IJ SOLN: 12.5000 mg | INTRAMUSCULAR | Status: DC | PRN

## 2015-12-31 MED ORDER — ONDANSETRON HCL 4 MG/2ML IJ SOLN: 4.0000 mg | Freq: Four times a day (QID) | INTRAMUSCULAR | Status: DC | PRN

## 2015-12-31 MED ORDER — SENNOSIDES-DOCUSATE SODIUM 8.6-50 MG PO TABS
2.0000 | ORAL_TABLET | ORAL | Status: DC
  Administered 2016-01-01 – 2016-01-02 (×2): 2 via ORAL
  Filled 2015-12-31 (×2): qty 2

## 2015-12-31 MED ORDER — SIMETHICONE 80 MG PO CHEW: 80.0000 mg | CHEWABLE_TABLET | ORAL | Status: DC | PRN

## 2015-12-31 MED ORDER — LACTATED RINGERS IV SOLN
INTRAVENOUS | Status: DC
  Administered 2015-12-31 (×2): via INTRAVENOUS

## 2015-12-31 MED ORDER — LIDOCAINE HCL (PF) 1 % IJ SOLN
INTRAMUSCULAR | Status: DC | PRN
  Administered 2015-12-31 (×2): 4 mL via EPIDURAL

## 2015-12-31 MED ORDER — PHENYLEPHRINE 40 MCG/ML (10ML) SYRINGE FOR IV PUSH (FOR BLOOD PRESSURE SUPPORT)
80.0000 ug | PREFILLED_SYRINGE | INTRAVENOUS | Status: DC | PRN
  Filled 2015-12-31: qty 10
  Filled 2015-12-31: qty 5

## 2015-12-31 MED ORDER — OXYCODONE-ACETAMINOPHEN 5-325 MG PO TABS: 1.0000 | ORAL_TABLET | ORAL | Status: DC | PRN

## 2015-12-31 MED ORDER — ACETAMINOPHEN 325 MG PO TABS: 650.0000 mg | ORAL_TABLET | ORAL | Status: DC | PRN

## 2015-12-31 MED ORDER — OXYTOCIN BOLUS FROM INFUSION
500.0000 mL | INTRAVENOUS | Status: DC
  Administered 2015-12-31: 500 mL via INTRAVENOUS

## 2015-12-31 MED ORDER — SOD CITRATE-CITRIC ACID 500-334 MG/5ML PO SOLN: 30.0000 mL | ORAL | Status: DC | PRN

## 2015-12-31 MED ORDER — PHENYLEPHRINE 40 MCG/ML (10ML) SYRINGE FOR IV PUSH (FOR BLOOD PRESSURE SUPPORT)
80.0000 ug | PREFILLED_SYRINGE | INTRAVENOUS | Status: DC | PRN
Start: 2015-12-31 — End: 2015-12-31
  Filled 2015-12-31: qty 5

## 2015-12-31 MED ORDER — TETANUS-DIPHTH-ACELL PERTUSSIS 5-2.5-18.5 LF-MCG/0.5 IM SUSP: 0.5000 mL | Freq: Once | INTRAMUSCULAR | Status: DC

## 2015-12-31 MED ORDER — DIBUCAINE 1 % RE OINT: 1.0000 "application " | TOPICAL_OINTMENT | RECTAL | Status: DC | PRN

## 2015-12-31 MED ORDER — LACTATED RINGERS IV SOLN
500.0000 mL | Freq: Once | INTRAVENOUS | Status: AC
  Administered 2015-12-31: 500 mL via INTRAVENOUS

## 2015-12-31 MED ORDER — PRENATAL MULTIVITAMIN CH
1.0000 | ORAL_TABLET | Freq: Every day | ORAL | Status: DC
  Filled 2015-12-31: qty 1

## 2015-12-31 MED ORDER — FENTANYL 2.5 MCG/ML BUPIVACAINE 1/10 % EPIDURAL INFUSION (WH - ANES)
14.0000 mL/h | INTRAMUSCULAR | Status: DC | PRN
  Administered 2015-12-31 (×2): 14 mL/h via EPIDURAL
  Filled 2015-12-31: qty 125

## 2015-12-31 MED ORDER — OXYCODONE HCL 5 MG PO TABS
5.0000 mg | ORAL_TABLET | ORAL | Status: DC | PRN
  Administered 2016-01-01: 5 mg via ORAL
  Filled 2015-12-31: qty 1

## 2015-12-31 MED ORDER — DIPHENHYDRAMINE HCL 25 MG PO CAPS: 25.0000 mg | ORAL_CAPSULE | Freq: Four times a day (QID) | ORAL | Status: DC | PRN

## 2015-12-31 MED ORDER — COCONUT OIL OIL: 1.0000 "application " | TOPICAL_OIL | Status: DC | PRN

## 2015-12-31 MED ORDER — OXYCODONE-ACETAMINOPHEN 5-325 MG PO TABS: 2.0000 | ORAL_TABLET | ORAL | Status: DC | PRN

## 2015-12-31 MED ORDER — BENZOCAINE-MENTHOL 20-0.5 % EX AERO
1.0000 "application " | INHALATION_SPRAY | CUTANEOUS | Status: DC | PRN
  Administered 2015-12-31 – 2016-01-02 (×2): 1 via TOPICAL
  Filled 2015-12-31 (×2): qty 56

## 2015-12-31 MED ORDER — ZOLPIDEM TARTRATE 5 MG PO TABS: 5.0000 mg | ORAL_TABLET | Freq: Every evening | ORAL | Status: DC | PRN

## 2015-12-31 MED ORDER — TERBUTALINE SULFATE 1 MG/ML IJ SOLN
0.2500 mg | Freq: Once | INTRAMUSCULAR | Status: DC | PRN
  Filled 2015-12-31: qty 1

## 2015-12-31 NOTE — Anesthesia Preprocedure Evaluation (Signed)
Anesthesia Evaluation  Patient identified by MRN, date of birth, ID band Patient awake    Reviewed: Allergy & Precautions, NPO status , Patient's Chart, lab work & pertinent test results  Airway Mallampati: II  TM Distance: >3 FB Neck ROM: Full    Dental no notable dental hx. (+) Teeth Intact   Pulmonary former smoker,    Pulmonary exam normal breath sounds clear to auscultation       Cardiovascular negative cardio ROS Normal cardiovascular exam Rhythm:Regular Rate:Normal     Neuro/Psych  Headaches, negative psych ROS   GI/Hepatic Neg liver ROS, GERD  Medicated and Controlled,  Endo/Other  Obesity  Renal/GU negative Renal ROS  negative genitourinary   Musculoskeletal negative musculoskeletal ROS (+)   Abdominal (+) + obese,   Peds  Hematology negative hematology ROS (+)   Anesthesia Other Findings   Reproductive/Obstetrics (+) Pregnancy                             Anesthesia Physical Anesthesia Plan  ASA: II  Anesthesia Plan: Epidural   Post-op Pain Management:    Induction:   Airway Management Planned: Natural Airway  Additional Equipment:   Intra-op Plan:   Post-operative Plan:   Informed Consent: I have reviewed the patients History and Physical, chart, labs and discussed the procedure including the risks, benefits and alternatives for the proposed anesthesia with the patient or authorized representative who has indicated his/her understanding and acceptance.     Plan Discussed with: Anesthesiologist, CRNA and Surgeon  Anesthesia Plan Comments:         Anesthesia Quick Evaluation

## 2015-12-31 NOTE — Progress Notes (Signed)
Patient ID: Mackenzie Holland, female   DOB: March 18, 1981, 35 y.o.   MRN: [id number] Feeling some mild pressure BP stable FHR Category 1  Cervix c/8-9/0  Making good progress.  Recheck in 30-45 min or if pressure worsens

## 2015-12-31 NOTE — Progress Notes (Signed)
Patient ID: Mackenzie Holland, female   DOB: August 20, 1980, 35 y.o.   MRN: [id number] Pt admitted and received an epidural due to advanced dilation and h/o fast labors  BP 130-140/80-90's afeb FHR category 1  Cervix 5/70/-2 AROM clear  Labs all WNL  Pitocin started and will follow progress. BP stable

## 2015-12-31 NOTE — Anesthesia Procedure Notes (Signed)
Epidural Patient location during procedure: OB Start time: 12/31/2015 10:29 AM  Staffing Anesthesiologist: Josephine Igo Performed by: anesthesiologist   Preanesthetic Checklist Completed: patient identified, site marked, surgical consent, pre-op evaluation, timeout performed, IV checked, risks and benefits discussed and monitors and equipment checked  Epidural Patient position: sitting Prep: site prepped and draped and DuraPrep Patient monitoring: continuous pulse ox and blood pressure Approach: midline Location: L3-L4 Injection technique: LOR air  Needle:  Needle type: Tuohy  Needle gauge: 17 G Needle length: 9 cm and 9 Needle insertion depth: 7 cm Catheter type: closed end flexible Catheter size: 19 Gauge Catheter at skin depth: 12 cm Test dose: negative and Other  Assessment Events: blood not aspirated, injection not painful, no injection resistance, negative IV test and no paresthesia  Additional Notes Patient identified. Risks and benefits discussed including failed block, incomplete  Pain control, post dural puncture headache, nerve damage, paralysis, blood pressure Changes, nausea, vomiting, reactions to medications-both toxic and allergic and post Partum back pain. All questions were answered. Patient expressed understanding and wished to proceed. Sterile technique was used throughout procedure. Epidural site was Dressed with sterile barrier dressing. No paresthesias, signs of intravascular injection Or signs of intrathecal spread were encountered.  Patient was more comfortable after the epidural was dosed. Please see RN's note for documentation of vital signs and FHR which are stable.

## 2015-12-31 NOTE — Progress Notes (Signed)
vd of viable female fetus at 2 by richardson md

## 2015-12-31 NOTE — Lactation Note (Signed)
This note was copied from a baby's chart. Lactation Consultation Note  P3, baby 23 hours old.  LS8. Mother had retained placenta with 1st child and breastmilk never fully transitioned. 2nd child she states she also had low milk supply.  Started supplementation and some pumping for 4 months. Mother states she really wants to do everything possible to hopefully exclusively breastfeed with this child. She has fenugreek and will start pumping if needed.  For now she plans to breastfeed often.  Mother is happy because for the first time she is able to hand express drops of colostrum easily from the start. Baby latched in football hold.  After breastfeeding on R side mother switched baby to L side in cross cradle. Demonstrated how to compress breast and hold until swallows observed. Mom encouraged to feed baby 8-12 times/24 hours and with feeding cues.  Reviewed basics. Mom made aware of O/P services, breastfeeding support groups, community resources, and our phone # for post-discharge questions.      Patient Name: Mackenzie Holland [id number] Date: 12/31/2015 Reason for consult: Initial assessment   Maternal Data Has patient been taught Hand Expression?: Yes Does the patient have breastfeeding experience prior to this delivery?: Yes  Feeding Feeding Type: Breast Fed  LATCH Score/Interventions Latch: Grasps breast easily, tongue down, lips flanged, rhythmical sucking.  Audible Swallowing: A few with stimulation Intervention(s): Skin to skin;Hand expression;Alternate breast massage  Type of Nipple: Everted at rest and after stimulation  Comfort (Breast/Nipple): Soft / non-tender     Hold (Positioning): Assistance needed to correctly position infant at breast and maintain latch.  LATCH Score: 8  Lactation Tools Discussed/Used     Consult Status Consult Status: Follow-up Date: 01/01/16 Follow-up type: In-patient    Vivianne Master [facility] 12/31/2015, 10:15 PM

## 2015-12-31 NOTE — Anesthesia Pain Management Evaluation Note (Signed)
  CRNA Pain Management Visit Note  Patient: Mackenzie Holland, 35 y.o., female  "Hello I am a member of the anesthesia team at [facility]. We have an anesthesia team available at all times to provide care throughout the hospital, including epidural management and anesthesia for C-section. I don't know your plan for the delivery whether it a natural birth, water birth, IV sedation, nitrous supplementation, doula or epidural, but we want to meet your pain goals."   1.Was your pain managed to your expectations on prior hospitalizations?   Yes   2.What is your expectation for pain management during this hospitalization?     Epidural  3.How can we help you reach that goal? Epidural  Record the patient's initial score and the patient's pain goal.   Pain: 0  Pain Goal: 5 [facility] wants you to be able to say your pain was always managed very well.  Casimer Lanius 12/31/2015

## 2015-12-31 NOTE — H&P (Signed)
Mackenzie Holland is a 35 y.o. female G3P2002 at 68 6/7 weeks (EDD 01/08/16 by LMP c/w 5 week [place]) presenting for IOL for gestational hypertension.  Prenatal care complicated by increasing BP first noted at 34 weeks.  BP running 130/90's and reently higher to 150/100.   She has been followed closely with normal labs except a protein:creatinine ratio of 0.18 on 12/27/15.  NST's have been followed and reactive 2x weekly.  Pregnancy also complicated by mild polyhydramnios of 22 of EFW of 86%ile on 12/06/15.  EFW today is estimated clinically at 8 1/2 to 9#.  The patient had some peersistent SOB early in pregnancy and was evaluated by cardiology with a negative w/u.    Maternal Medical History:  Contractions: Frequency: irregular.   Perceived severity is mild.    Fetal activity: Perceived fetal activity is normal.    Prenatal complications: PIH.   Prenatal Complications - Diabetes: none.    OB History    Gravida Para Term Preterm AB TAB SAB Ectopic Multiple Living   3 2 2       2     2008 7#12 oz NSVD 2012 7#8oz NSVD   Past Medical History  Diagnosis Date  . Migraine   . GERD (gastroesophageal reflux disease)    Past Surgical History  Procedure Laterality Date  . Adenoidectomy    . Leg surgery Left 1998    Plastic Surg  . Dilation and curettage of uterus     Family History: family history includes Hypertension in her father. Social History:  reports that she quit smoking about 9 years ago. She has never used smokeless tobacco. She reports that she does not drink alcohol or use illicit drugs.   Prenatal Transfer Tool  Maternal Diabetes: No Genetic Screening: Declined Maternal Ultrasounds/Referrals: Abnormal:  Findings:   Other: mild polyhydramnios Fetal Ultrasounds or other Referrals:  None Maternal Substance Abuse:  No Significant Maternal Medications:  None Significant Maternal Lab Results:  None Other Comments:  None  Review of Systems  Gastrointestinal: Negative for  abdominal pain.  Neurological: Negative for headaches.      Blood pressure 142/99, pulse 99, temperature 98 F (36.7 C), temperature source Oral, resp. rate 17. Maternal Exam:  Uterine Assessment: Contraction strength is mild.  Contraction frequency is irregular.   Abdomen: Patient reports no abdominal tenderness. Fetal presentation: vertex  Introitus: Normal vulva. Normal vagina.    Physical Exam  Constitutional: She appears well-developed.  Cardiovascular: Normal rate and regular rhythm.   Respiratory: Effort normal.  GI: Soft.  Genitourinary: Vagina normal.  Neurological: She is alert.  Psychiatric: She has a normal mood and affect.    Prenatal labs: ABO, Rh:  A positive Antibody:  negative Rubella:  Immune RPR:   NR HBsAg:   Neg HIV:   NR GBS: Negative (07/04 0900)  One hour GCT 50 Declined genetic screens   Assessment/Plan: Pt with gestational hypertension for IOL.  Advanced cervical dilation to 4-5cm so will receive epidural prior to AROM.   Plan pitocin and AROM.  Will check [place] labs to ensure still WNL.  No evidence of preeclampsia   [name] W 12/31/2015, 9:04 AM

## 2016-01-01 LAB — CBC
HEMATOCRIT: 30.3 % — AB (ref 36.0–46.0)
Hemoglobin: 10 g/dL — ABNORMAL LOW (ref 12.0–15.0)
MCH: 27.6 pg (ref 26.0–34.0)
MCHC: 33 g/dL (ref 30.0–36.0)
MCV: 83.7 fL (ref 78.0–100.0)
Platelets: 193 10*3/uL (ref 150–400)
RBC: 3.62 MIL/uL — ABNORMAL LOW (ref 3.87–5.11)
RDW: 14.1 % (ref 11.5–15.5)
WBC: 15.7 10*3/uL — ABNORMAL HIGH (ref 4.0–10.5)

## 2016-01-01 NOTE — Progress Notes (Signed)
PPD #1 No problems Afeb, VSS, BP normal so far since delivery Fundus firm, NT at U-1 Hgb 12.3 to 10.0 Continue routine postpartum care, monitor BP

## 2016-01-01 NOTE — Anesthesia Postprocedure Evaluation (Signed)
Anesthesia Post Note  Patient: Mackenzie Holland  Procedure(s) Performed: * No procedures listed *  Patient location during evaluation: Mother Baby Anesthesia Type: Epidural Level of consciousness: awake and alert Pain management: pain level controlled Vital Signs Assessment: post-procedure vital signs reviewed and stable Respiratory status: spontaneous breathing, nonlabored ventilation and respiratory function stable Cardiovascular status: stable Postop Assessment: no headache, no backache and epidural receding Anesthetic complications: no     Last Vitals:  Filed Vitals:   12/31/15 2110 01/01/16 0622  BP: 130/84 128/79  Pulse: 84 68  Temp: 36.7 C 36.9 C  Resp: 18 18    Last Pain:  Filed Vitals:   01/01/16 0925  PainSc: 4    Pain Goal: Patients Stated Pain Goal: 2 (01/01/16 0740)               Riki Sheer

## 2016-01-02 LAB — RPR: RPR Ser Ql: NONREACTIVE

## 2016-01-02 MED ORDER — IBUPROFEN 600 MG PO TABS: 600.0000 mg | ORAL_TABLET | Freq: Four times a day (QID) | ORAL | Status: DC | PRN

## 2016-01-02 NOTE — Progress Notes (Signed)
Post Partum Day 2 Subjective: no complaints, up ad lib, voiding, tolerating PO, + flatus and bonding well with baby. Breastfeeding. Ready for discharge to home today  Objective: Blood pressure 119/73, pulse 72, temperature 97.6 F (36.4 C), temperature source Oral, resp. rate 18, height 5\' 8"  (1.727 m), weight 247 lb (112.038 kg), SpO2 100 %, unknown if currently breastfeeding.  Physical Exam:  General: alert, cooperative and no distress Lochia: appropriate Uterine Fundus: firm Incision: n/a DVT Evaluation: No evidence of DVT seen on physical exam. Calf/Ankle edema is present.   Recent Labs  12/31/15 0910 01/01/16 0517  HGB 12.3 10.0*  HCT 36.4 30.3*    Assessment/Plan: Discharge home, Breastfeeding and Contraception undecided   LOS: 2 days   [place] 01/02/2016, 9:27 AM

## 2016-01-02 NOTE — Lactation Note (Signed)
This note was copied from a baby's chart. Lactation Consultation Note  Patient Name: Mackenzie Holland [id number] Date: 01/02/2016 Reason for consult: Follow-up assessment;Infant weight loss   Follow up with exp BF mom of 77 hour old infant. Infant with 8 BF for 20-60 minutes, 5 attempts, 1 void and 5 stools in 24 hours prior to this assessment. Infant weight 8 lb 4.6 oz with weight loss of 7%  Since birth. LATCH Scores 9 by bedside RN.  Mom had infant latched in football hold to left breast when I went into room, discussed how to position the infant further back so mom does not have to lean over him to feed. He had fallen asleep and mom removed him from the breast after I entered the room. Mom's nipple was round when infant came off. Mom report nipple tenderness with latch that improves with feeding. She is using EBM to nipples post feed.   Mom reports she had milk supply issues with her first 2 children, one due to retained placenta. She reports she has been hand expressing at home for the past month and started taking Fenugreek this morning. She is requesting and OP appt, one scheduled for Tuesday 01/07/16 @ 4 pm. Appointment reminder given. Mom is aware of BF support Groups and plans to attend at some point. Mom reports she is feeling fuller today and can express colostrum.   Mom is a [employer] employee and has a PIS for home use. Reviewed all BF information in Taking Care of Baby and Me Booklet. Reviewed Engorgement Prevention/treatment. Reviewed hand expression prior to and after BF to stimulate production. Reviewed I/O and maintaining feeding log to take to [facility] appt tomorrow and LC appt, mom voiced understanding. Reviewed BF basics and enc mom to feed 8-12 x in 24 hours at first feeding cues. Enc mom to pump a few times a day to stimulate milk production also.       Maternal Data Formula Feeding for Exclusion: No Has patient been taught Hand Expression?: Yes Does the patient have breastfeeding  experience prior to this delivery?: Yes  Feeding Feeding Type: Breast Fed Length of feed: 20 min  LATCH Score/Interventions                      Lactation Tools Discussed/Used WIC Program: No Pump Review: Setup, frequency, and cleaning;Milk Storage   Consult Status Consult Status: Follow-up Date: 01/07/16 Follow-up type: Out-patient    Debby Freiberg Mackenzie Holland 01/02/2016, 9:55 AM

## 2016-01-02 NOTE — Discharge Summary (Signed)
OB Discharge Summary     Patient Name: Mackenzie Holland DOB: [birth year] MRN: [id number]  Date of admission: 12/31/2015 Delivering MD: Paula Compton   Date of discharge: 01/02/2016  Admitting diagnosis: INDUCTION Intrauterine pregnancy: [redacted]w[redacted]d     Secondary diagnosis:  Active Problems:   NSVD (normal spontaneous vaginal delivery)  Additional problems: none     Discharge diagnosis: Term Pregnancy Delivered and Gestational Hypertension                                                                                                Post partum procedures:none  Augmentation: AROM and Pitocin  Complications: None  Hospital course:  Induction of Labor With Vaginal Delivery   35 y.o. yo G3P3001 at [redacted]w[redacted]d was admitted to the hospital 12/31/2015 for induction of labor.  Indication for induction: Favorable cervix at term and Gestational hypertension.  Patient had an uncomplicated labor course as follows: Membrane Rupture Time/Date: 11:22 AM ,12/31/2015   Intrapartum Procedures: Episiotomy: None [1]                                         Lacerations:  2nd degree [3]  Patient had delivery of a Viable infant.  Information for the patient's newborn:  Cinderella, [place] [id number]  Delivery Method: Vaginal, Spontaneous Delivery (Filed from Delivery Summary)   12/31/2015  Details of delivery can be found in separate delivery note.  Patient had a routine postpartum course. Patient is discharged home 01/02/2016.   Physical exam  Filed Vitals:   12/31/15 2110 01/01/16 0622 01/01/16 1725 01/02/16 0627  BP: 130/84 128/79 132/74 119/73  Pulse: 84 68 79 72  Temp: 98 F (36.7 C) 98.4 F (36.9 C) 98.2 F (36.8 C) 97.6 F (36.4 C)  TempSrc: Oral Oral Oral Oral  Resp: 18 18 16 18   Height:      Weight:      SpO2:  100% 100%    General: alert, cooperative and no distress Lochia: appropriate Uterine Fundus: firm Incision: N/A DVT Evaluation: No evidence of DVT seen on physical  exam. Calf/Ankle edema is present Labs: Lab Results  Component Value Date   WBC 15.7* 01/01/2016   HGB 10.0* 01/01/2016   HCT 30.3* 01/01/2016   MCV 83.7 01/01/2016   PLT 193 01/01/2016   CMP Latest Ref Rng 12/31/2015  Glucose 65 - 99 mg/dL 83  BUN 6 - 20 mg/dL 9  Creatinine 0.44 - 1.00 mg/dL 0.72  Sodium 135 - 145 mmol/L 133(L)  Potassium 3.5 - 5.1 mmol/L 4.5  Chloride 101 - 111 mmol/L 103  CO2 22 - 32 mmol/L 21(L)  Calcium 8.9 - 10.3 mg/dL 8.9  Total Protein 6.5 - 8.1 g/dL 6.4(L)  Total Bilirubin 0.3 - 1.2 mg/dL 1.1  Alkaline Phos 38 - 126 U/L 127(H)  AST 15 - 41 U/L 28  ALT 14 - 54 U/L 20    Discharge instruction: per After Visit Summary and "Baby and Me Booklet".  After visit meds:  Medication List    STOP taking these medications        acetaminophen 500 MG tablet  Commonly known as:  TYLENOL      TAKE these medications        docusate sodium 100 MG capsule  Commonly known as:  COLACE  Take 100 mg by mouth at bedtime.     famotidine 20 MG tablet  Commonly known as:  PEPCID  Take 20 mg by mouth 2 (two) times daily.     ibuprofen 600 MG tablet  Commonly known as:  ADVIL,MOTRIN  Take 1 tablet (600 mg total) by mouth every 6 (six) hours as needed.     prenatal multivitamin Tabs tablet  Take 1 tablet by mouth daily at 12 noon.        Diet: routine diet  Activity: Advance as tolerated. Pelvic rest for 6 weeks.   Outpatient follow up:5weeks Follow up Appt:No future appointments. Follow up Visit:No Follow-up on file.  Postpartum contraception: Undecided ( had mirena in past)  Newborn Data: Live born female  Birth Weight: 8 lb 14.7 oz (4046 g) APGAR: 9, 9  Baby Feeding: Breast Disposition:home with mother   01/02/2016 [place], DO

## 2016-01-02 NOTE — Discharge Instructions (Signed)
Nothing in vagina for 6 weeks.  No sex, tampons, and douching.  Other instructions as in [facility] Discharge Booklet. °

## 2016-01-06 ENCOUNTER — Telehealth (HOSPITAL_COMMUNITY): Payer: Self-pay | Admitting: Lactation Services

## 2016-01-06 ENCOUNTER — Telehealth (HOSPITAL_COMMUNITY): Payer: Self-pay

## 2016-01-06 NOTE — Telephone Encounter (Signed)
Baby was weighed at Dr. Maxie Barb office on Friday and was at an 11% weight loss.  Mom has a Hx low ms and is taking Reglan and Fenugreek.  SHe also initiated hand expression prior to discharge.  She believes that things have turned around since Friday.  Baby is eating 8-12 times in 24 hours and mom reports breast softening with feedings.  Feedings are every 1-3 hours.  Some of the feedings last 45-60 min.Baby is voiding at least 6 x in 24 hours and having stools 4-5 times. They are currently green and mother does not recall "seeds" in the stool. The last of the meconium passed Friday or Sat. Encouraged mother to post-pump 4-6 times in  24 hours and to feed it back to the baby. Cup feeding was explained and mother was told NOT to pour milk into the baby's mouth. Weight check today and mother to call back with results to see if plan needs adjusting. Lactation Apointment Tuesday Appointment Tuesday

## 2016-01-06 NOTE — Telephone Encounter (Signed)
Pt called back to report baby's weight has not changed since Friday and is still down (11%). Pediatrician suggested she consult with [place] at her lactation appointment tomorrow. Lactation Consultant Suggested she intitiate post-pumping 6 times in 24 and feed any amount back to the back until her appointment tomorrow.

## 2016-01-07 ENCOUNTER — Ambulatory Visit (HOSPITAL_COMMUNITY)
Admit: 2016-01-07 | Discharge: 2016-01-07 | Disposition: A | Discharge status: Home / Self Care | Payer: 59 | Attending: Obstetrics & Gynecology | Admitting: Obstetrics & Gynecology

## 2016-01-07 NOTE — Lactation Note (Signed)
Lactation Consult  Mother's reason for visit:  Feeding assessment, Hx LMS Visit Type:  Outpatient Appointment Notes:  Mom concerned about milk supply, had LMS with 2 older children. Baby at 11% weight loss on Friday and the same with weight check yesterday. Baby now 73 days old. Mom reports baby is chewing at the breast, sleepy, non-nutritive suckling observed by Mom. Per phone call with lactation yesterday, Mom started pumping. She has pumped 2 times today receiving 15-30 ml total. Mom supplemented baby with EBM via syringe 2 times today 15 ml total. Mom would like LC to evaluate for "tongue-tie". Mom has started Fenugreek 610 mg 3 caps TID. Consult:  Initial Lactation Consultant:  Mackenzie Holland  ________________________________________________________________________  59 Name: Mackenzie Holland Date of Birth: 12/31/2013 Pediatrician: Dr. Sherryle [facility], [facility], [place] Gender: female Gestational Age: [redacted]w[redacted]d (At Birth) Birth Weight: 8 lb 14.7 oz (4046 g) Weight at Discharge: Weight: 8 lb 4.6 oz (3760 g)Date of Discharge: 01/02/2016 [facility] Weights   [birth year]/31/[birth year] 1355 01/01/16 0011 01/02/16 0017  Weight: 8 lb 14.7 oz (4046 g) 8 lb 10.3 oz (3920 g) 8 lb 4.6 oz (3760 g)   Last weight taken from location outside of [facility]: 01/06/16  7 lb. 15.0 oz Location:Pediatrician's office Weight today: 7 lb. 14.1 oz/3574 gm with clean diaper     ________________________________________________________________________  Mother's Name: Mackenzie Holland Type of delivery:  SVB Breastfeeding Experience:  P3 - With 1st child had retained placenta. Breast milk did not come in well. Mom took Reglan but could not exclusively BF, by 3-4 months stopped due to [facility], pain with nursing. With 2nd child BF for 3-4 months but had LMS again, no pain with latch.  Maternal Medical Conditions:  N/A Maternal Medications:  Motrin prn,  Fenugreek  ________________________________________________________________________  Breastfeeding History (Post Discharge)  Frequency of breastfeeding:  On demand, every 2 hours or more Duration of feeding:  15-45 minutes  Mom had Medela PNS and started pumping yesterday, pumped 2 times receiving 15-30 ml. Baby was supplemented 2 times via finger feeding w/syringe 15 ml total.   Infant Intake and Output Assessment  Voids:  4-5 in 24 hrs.  Color:  Clear yellow Stools:  3-4 in 24 hrs.  Color:  Green/seedy  ________________________________________________________________________  Maternal Breast Assessment  Breast:  Soft Nipple:  Erect, small white blister right nipple Pain level:  4 Pain interventions:  coconut oil  _______________________________________________________________________ Feeding Assessment/Evaluation  Initial feeding assessment:  Infant's oral assessment:  Variance. Short, thick labial frenulum noted. Mom reports baby has difficulty keeping upper lip well flanged with feedings. Possible deep posterior lingual frenulum.   Positioning:  Cross cradle Left breast   Mom latched baby without assist on left breast, lower lip had difficulty staying un-tucked. At beginning of feeding baby demonstrated few good suckling bursts but during the feeding intermittent chewing observed, non-nutritive suckling observed off/on. Stimulation needed to keep baby nursing. Baby becomes very sleepy. PS =2 with initial latch decreasing to 0 with baby nursing. Mom's nipple with some compression visible at the end of the feeding.   Pre-feed weight:  3574 g  (7 lb. 14.1 oz.) Post-feed weight:  3584 g (7 lb. 14.4 oz.) Amount transferred:  10 ml with nursing for 15 minutes  Mom latched baby to right breast in football hold using #20 nipple shield. LC initiated nipple shield to see if this would help baby stay stimulated at breast. Baby did appear to sustain a deeper latch, baby appeared to  have a more nutritive suckling pattern but only transferred 6 ml in 21 minutes. PS =3 with initial latch resolving as baby nursed. Nipple round when baby came off the breast.   Supplemented baby with 18 ml of EBM via syringe and 5 fr feeding tube at breast. Mom pumped for 20 minutes and received an additional 10 ml of breast milk which LC finger fed back to baby with curved tipped syringe.  Total transfer: 16 ml. Total supplement: 28 ml.   At this visit, [place] felt baby had disorganized suck with suck exam and has difficulty sustaining a nutritive suckling pattern at the breast. Very sleepy at breast.  Stressed to Mom the importance of feeding baby and maximizing milk production as we work on improving latch.  Feeding plan discussed with Mom: BF each feeding for 15-20 minutes each breast, 8-12 times or more in 24 hours. Use nipple shield #20 if baby appears to sustain a deeper latch and nutritive suckle. Post pump every 3 hours for 15-30 minutes. Can power pump 1 time in AM and at bedtime. During the night consider pump/bottle till baby transferring milk better. Supplement baby each feeding with 45 ml or more if needed to satisfy baby with each feeding. Use 46fr feeding tube at breast or finger feed. If using bottle recommended Dr. Owens Shark #1.  Change supplement to support milk production to Lactation support by [place] or More Milk Plus by [employer], stop Fenugreek when taking these other supplements. Mom plans to see oral specialist regarding frenulum, Information for Dr. Verdene Lennert in [place] given to Mom per her request. OP f/u with lactation scheduled for Thursday, 01/16/16 at 10:30.  Encouraged support group.

## 2016-01-16 ENCOUNTER — Ambulatory Visit (HOSPITAL_COMMUNITY)
Admission: RE | Admit: 2016-01-16 | Discharge: 2016-01-16 | Disposition: A | Discharge status: Home / Self Care | Payer: 59 | Source: Ambulatory Visit | Attending: Obstetrics and Gynecology | Admitting: Obstetrics and Gynecology

## 2016-01-16 NOTE — Lactation Note (Signed)
Lactation Consult: weight today 8- 12.7 oz 3988 g  Mother's reason for visit:  Follow up visit after tongue tie revision Visit Type:  Feeding assessment Appointment Notes: Tongue tie revision last Thursday by Dr Verdene Lennert in [place]:  Follow-Up Lactation Consultant:  Truddie Crumble  ________________________________________________________________________  Mackenzie Holland Name: Mackenzie Holland Date of Birth: 12/31/2013 [facility]ian: [facility] Gender: female Gestational Age: [redacted]w[redacted]d (At Birth) Birth Weight: 8 lb 14.7 oz (4046 g) Weight at Discharge: Weight: 8 lb 4.6 oz (3760 g)Date of Discharge: 01/02/2016 [facility] Weights   [birth year] 1355 01/01/16 0011 01/02/16 0017  Weight: 8 lb 14.7 oz (4046 g) 8 lb 10.3 oz (3920 g) 8 lb 4.6 oz (3760 g)        ________________________________________________________________________  Mother's Name: Mackenzie Holland Breastfeeding Experience:  P3. Low milk supply with both previous babies ________________________________________________________________________  Breastfeeding History (Post Discharge)  Frequency of breastfeeding:  Q 2-3 hours Duration of feeding:  30- 45 min  Supplementation  Formula:  Volume 30-60 ml Frequency:  Most feedings        Brand: Similac  Breastmilk:  Volume 30-60 ml Frequency:  As available  Method:  Bottle,   Pumping  Type of pump:  Medela pump in style Frequency:  6 times/day Volume: 30-60 ml  Infant Intake and Output Assessment  Voids:  8+ in 24 hrs.  Color:  Clear yellow Stools:  3+ in 24 hrs.  Color:  Brown and Yellow  ________________________________________________________________________  Maternal Breast Assessment  Breast:  Filling Nipple:  Erect, slightly pink  _______________________________________________________________________ Feeding Assessment/Evaluation  Initial feeding assessment:  Infant's oral assessment:  Variance- had tongue and lip  revision done last Thursday  Positioning:  Football Left breast  LATCH documentation:  Latch:  2 = Grasps breast easily, tongue down, lips flanged, rhythmical sucking.  Audible swallowing: 1  Type of nipple:  2 = Everted at rest and after stimulation  Comfort (Breast/Nipple):  2 = Soft / non-tender  Hold (Positioning):  2 = No assistance needed to correctly position infant at breast  LATCH score:  9  Attached assessment:  Deep  Lips flanged:  Yes.    Lips untucked:  No.  Suck assessment:  Nutritive and Nonnutritive- mostly nonnutritive after 5 min   Pre-feed weight:  3988  g 8- 12.7 oz Post-feed weight:  4014 g 8-13.6 oz Amount transferred:  26 ml Amount supplemented:  0 ml  Cainen latched well. Lots of swallows heard initially but after about 5 min- getting sleepy and non nutritivie. Mom reports it feels much better since procedure. Nipples are healing. Concern is milk supply. After some feeding he seems satisfied and she does not supplement   Pre-feed weight:  4014 g  8-13.6 oz Post-feed weight:  4030 g 8- 14.2 oz Amount transferred:  16 ml Amount supplemented:  12 ml  Again he latched well but getting sleepy after 5 min. Encouraged mom to massage and compress her breast while he is nursing. Encouraged to continue pumping to promote a good milk supply. Is taking [place] Lactation Support to increase milk supply. To see Ped at 2 month checkup. Suggested weight check more frequently to make sure of good weight gain,. Reviewed [facility] as resource.No questions at present. To call prn or if wants another OP appointment   Total amount transferred:  42 ml Total supplement given:  12 ml

## 2016-02-07 MED FILL — METOCLOPRAMIDE 10 MG TABLET: 10 | 30 days supply | Qty: 90 | Fill #0

## 2016-02-11 DIAGNOSIS — Z1389 Encounter for screening for other disorder: Secondary | ICD-10-CM | POA: Diagnosis not present

## 2016-02-11 DIAGNOSIS — Z3009 Encounter for other general counseling and advice on contraception: Secondary | ICD-10-CM | POA: Diagnosis not present

## 2016-02-27 ENCOUNTER — Ambulatory Visit (HOSPITAL_COMMUNITY)
Admission: RE | Admit: 2016-02-27 | Discharge: 2016-02-27 | Disposition: A | Discharge status: Home / Self Care | Payer: 59 | Source: Ambulatory Visit | Attending: Obstetrics and Gynecology | Admitting: Obstetrics and Gynecology

## 2016-02-27 NOTE — Lactation Note (Signed)
Lactation Consult  Mother's reason for visit:  History of low milk supply, weight check.  Tongue tie revision at 28 nine days old - [place]. Visit Type:  Feeding Assessment Appointment Notes:  Oral assessment tongue slightly coated white.  Baby has good tongue elevation, noted some protrusion.  Suggest suck exercises.  Mother's nipples are pink and tender.  Made her aware of S&S of thrush and suggest she contact MD if pain/symptoms increase for treatment.  Provided mother with shells to wear in bra for comfort.  Baby latched on both side in football hold.  Noticed both NNS and NS sucking and swallows.  Recommend mother try and fit in some pumping sessions for approx 20 min.  Mother is taking galactagogue to help her milk supply improve. Consult:  Follow-Up Lactation Consultant:  Carlye Grippe  ________________________________________________________________________ Mackenzie Holland Name:  [name] Date of Birth:  12/31/2013 Pediatrician:   [facility] Gender:  female Gestational Age: [redacted]w[redacted]d (At Birth) Birth Weight:  8 lb 14.7 oz (4046 g) Weight at Discharge:  Weight: 8 lb 4.6 oz (3760 g)                                   Date of Discharge:  01/02/2016      Filed Weights   [birth year]/31/15 1355 01/01/16 0011 01/02/16 0017  Weight: 8 lb 14.7 oz (4046 g) 8 lb 10.3 oz (3920 g) 8 lb 4.6 oz (3760 g)  Last weight taken from location outside of [facility]:  10 lb 2 oz     Location:Pediatrician's office Weight today:  10 lb 13.5 oz    ________________________________________________________________________  Mother's Name: [name] ________________________________________________________________________  Breastfeeding History (Post Discharge)  Frequency of breastfeeding:  Every 1.5 - 3 hours Duration of feeding:  20 min to 1 hour  Supplementation  Formula:  Volume 20ml Frequency:  Once at night Total volume per day:  20ml       Brand:  Similac  Breastmilk:  Volume 69ml Frequency:  Once every other day Total volume per day:  29ml (rarely pumping)  Method:  Bottle, occasionally uses special feeder  Pumping  Type of pump:  Medela pump in style Frequency:  Rarely Volume:  15-30 ml  Infant Intake and Output Assessment  Voids:  6-7 in 24 hrs.  Color:  Clear yellow Stools:  1 in 24 hrs.  Color:  Yellow  ________________________________________________________________________  Maternal Breast Assessment  Breast:  Soft Nipple:  Erect Pain level:  3 Pain interventions:  Inverted shells  _______________________________________________________________________ Feeding Assessment/Evaluation  Initial feeding assessment:  Infant's oral assessment:  Variance  Positioning:  Football Right breast  LATCH documentation:  Latch:  2 = Grasps breast easily, tongue down, lips flanged, rhythmical sucking.  Audible swallowing:  1 = A few with stimulation  Type of nipple:  2 = Everted at rest and after stimulation  Comfort (Breast/Nipple):  1 = Filling, red/small blisters or bruises, mild/mod discomfort  Hold (Positioning):  2 = No assistance needed to correctly position infant at breast  LATCH score:  8  Attached assessment:  Deep  Lips flanged:  Yes.    Lips untucked:  Yes.    Suck assessment:  Displays both  Pre-feed weight:  4918 g  (10 lb. 13.5 oz.) Post-feed weight:  4970 g (10 lb. 15.3 oz.) Amount transferred:  52 ml   Total amount transferred:  52 ml

## 2016-04-13 ENCOUNTER — Telehealth: Payer: 59 | Admitting: Family

## 2016-04-13 DIAGNOSIS — J019 Acute sinusitis, unspecified: Secondary | ICD-10-CM | POA: Diagnosis not present

## 2016-04-13 MED ORDER — AMOXICILLIN-POT CLAVULANATE 875-125 MG PO TABS: 1.0000 | ORAL_TABLET | Freq: Two times a day (BID) | ORAL | 0 refills | Status: DC

## 2016-04-13 NOTE — Progress Notes (Signed)

## 2016-06-18 ENCOUNTER — Telehealth: Payer: 59 | Admitting: Physician Assistant

## 2016-06-18 DIAGNOSIS — Z789 Other specified health status: Secondary | ICD-10-CM

## 2016-06-18 DIAGNOSIS — J019 Acute sinusitis, unspecified: Secondary | ICD-10-CM

## 2016-06-18 NOTE — Progress Notes (Signed)
For the safety of you and your child, I recommend a face to face office visit with a health care provider.  Many mothers need to take medicines during their pregnancy and while nursing.  Almost all medicines pass into the breast milk in small quantities.  Most are generally considered safe for a mother to take but some medicines must be avoided.  After reviewing your E-Visit request, I recommend that you consult your OB/GYN or pediatrician for medical advice in relation to your condition and prescription medications while pregnant or breastfeeding.  If you are having a true medical emergency please call 911.  If you need an urgent face to face visit, [facility] has four urgent care centers for your convenience.  If you need care fast and have a high deductible or no insurance consider:   [email or web address]  [phone]  [address], [place], [place] [address] 8 am to 8 pm Monday-Friday 10 am to 4 pm Saturday-Sunday   The following sites will take your  insurance:    . [facility] [place] a Provider at this Location  [address] [place], [place] [address] . 10 am to 8 pm Monday-Friday . 12 pm to 8 pm Saturday-Sunday   . [facility] [place] a Provider at this Location  [address], [place], [place] [address] . 8 am to 8 pm Monday-Friday . 9 am to 6 pm Saturday . 11 am to 6 pm Sunday   . [facility] at [place] Get Driving Directions  [id number], [place] [address] . 8 am to 8 pm Monday-Friday . 8 am to 4 pm Saturday-Sunday   . [facility] (a walk in primary care provider)  [place] a Provider at this Location  [place], [place] [address] . 8 am to 8:30 pm Monday-Thursday . 8 am  to 6 pm Friday . 8 am to 4 pm Saturday-Sunday   Your e-visit answers were reviewed by a board certified advanced clinical practitioner to complete your personal care plan.  Thank you for using e-Visits.

## 2017-04-22 MED FILL — LARIN FE 1-20 TABLET: 1-20 | 84 days supply | Qty: 84 | Fill #0 | Status: TO

## 2017-05-25 DIAGNOSIS — H527 Unspecified disorder of refraction: Secondary | ICD-10-CM | POA: Diagnosis not present

## 2017-08-06 MED FILL — LARIN FE 1-20 TABLET: 1-20 | 84 days supply | Qty: 84 | Fill #0

## 2017-08-19 DIAGNOSIS — Z130 Encounter for screening for diseases of the blood and blood-forming organs and certain disorders involving the immune mechanism: Secondary | ICD-10-CM | POA: Diagnosis not present

## 2017-08-19 DIAGNOSIS — Z6835 Body mass index (BMI) 35.0-35.9, adult: Secondary | ICD-10-CM | POA: Diagnosis not present

## 2017-08-19 DIAGNOSIS — N920 Excessive and frequent menstruation with regular cycle: Secondary | ICD-10-CM | POA: Diagnosis not present

## 2017-08-19 DIAGNOSIS — Z1389 Encounter for screening for other disorder: Secondary | ICD-10-CM | POA: Diagnosis not present

## 2017-08-19 DIAGNOSIS — Z01419 Encounter for gynecological examination (general) (routine) without abnormal findings: Secondary | ICD-10-CM | POA: Diagnosis not present

## 2017-08-19 MED FILL — MELODETTA 24 FE CHEWABLE TA: 1-20 | 84 days supply | Qty: 84 | Fill #0

## 2017-11-17 MED FILL — MELODETTA 24 FE CHEWABLE TA: 1-20 | 84 days supply | Qty: 84 | Fill #1

## 2017-12-14 DIAGNOSIS — J040 Acute laryngitis: Secondary | ICD-10-CM | POA: Diagnosis not present

## 2017-12-14 DIAGNOSIS — J0100 Acute maxillary sinusitis, unspecified: Secondary | ICD-10-CM | POA: Diagnosis not present

## 2017-12-28 ENCOUNTER — Ambulatory Visit: Payer: Self-pay | Admitting: Family Medicine

## 2017-12-28 VITALS — BP 135/85 | HR 101 | Temp 98.1°F | Resp 16 | Wt 223.2 lb

## 2017-12-28 DIAGNOSIS — J029 Acute pharyngitis, unspecified: Secondary | ICD-10-CM

## 2017-12-28 DIAGNOSIS — H6692 Otitis media, unspecified, left ear: Secondary | ICD-10-CM

## 2017-12-28 MED ORDER — DOXYCYCLINE HYCLATE 100 MG PO TABS: 100.0000 mg | ORAL_TABLET | Freq: Two times a day (BID) | ORAL | 0 refills | Status: DC

## 2017-12-28 MED ORDER — CEFDINIR 300 MG PO CAPS: 300.0000 mg | ORAL_CAPSULE | Freq: Two times a day (BID) | ORAL | 0 refills | Status: AC

## 2017-12-28 NOTE — Progress Notes (Signed)
Mackenzie Holland is a 37 y.o. female who presents today with concerns of a returning sore throat after treatment with steroids and antibiotic in the last 2 weeks. Symptoms resolved and then slowly began to return. Patient reports initial symptoms were abrupt onset and severe and this is a more gradual return of symptoms. She reports left ear pain as well.  Review of Systems  Constitutional: Negative for chills, fever and malaise/fatigue.  HENT: Positive for ear pain and sore throat. Negative for congestion, ear discharge and sinus pain.   Eyes: Negative.   Respiratory: Negative for cough, sputum production and shortness of breath.   Cardiovascular: Negative.  Negative for chest pain.  Gastrointestinal: Negative for abdominal pain, diarrhea, nausea and vomiting.  Genitourinary: Negative for dysuria, frequency, hematuria and urgency.  Musculoskeletal: Negative for myalgias.  Skin: Negative.   Neurological: Negative for headaches.  Endo/Heme/Allergies: Negative.   Psychiatric/Behavioral: Negative.     O: Vitals:   12/28/17 1954  BP: 135/85  Pulse: (!) 101  Resp: 16  Temp: 98.1 F (36.7 C)  SpO2: 91%     Physical Exam  Constitutional: She is oriented to person, place, and time. Vital signs are normal. She appears well-developed and well-nourished. She is active.  Non-toxic appearance. She does not have a sickly appearance.  HENT:  Head: Normocephalic.  Right Ear: Hearing, tympanic membrane, external ear and ear canal normal.  Left Ear: Hearing, external ear and ear canal normal. Tympanic membrane is injected and erythematous.  Nose: Rhinorrhea present.  Mouth/Throat: Uvula is midline and oropharynx is clear and moist.  Neck: Normal range of motion. Neck supple.  Cardiovascular: Normal rate, regular rhythm, normal heart sounds and normal pulses.  Pulmonary/Chest: Effort normal and breath sounds normal.  Abdominal: Soft. Bowel sounds are normal.  Musculoskeletal: Normal range of  motion.  Lymphadenopathy:       Head (right side): No submental and no submandibular adenopathy present.       Head (left side): No submental and no submandibular adenopathy present.    She has no cervical adenopathy.  Neurological: She is alert and oriented to person, place, and time.  Psychiatric: She has a normal mood and affect.  Vitals reviewed.  A: 1. Pharyngitis, unspecified etiology   2. Left otitis media, unspecified otitis media type    P: Exam findings, diagnosis etiology and medication use and indications reviewed with patient. Follow- Up and discharge instructions provided. No emergent/urgent issues found on exam.  Patient verbalized understanding of information provided and agrees with plan of care (POC), all questions answered.  1. Pharyngitis, unspecified etiology - cefdinir (OMNICEF) 300 MG capsule; Take 1 capsule (300 mg total) by mouth 2 (two) times daily for 10 days.  May consider doxycycline 100 mg BID x 10 days if not effective by 5 days of treatment patient advised to call if symptoms unimproved.  2. Left otitis media, unspecified otitis media type - cefdinir (OMNICEF) 300 MG capsule; Take 1 capsule (300 mg total) by mouth 2 (two) times daily for 10 days.  Other orders - predniSONE (DELTASONE) 20 MG tablet - amoxicillin (AMOXIL) 875 MG tablet

## 2017-12-28 NOTE — Patient Instructions (Signed)

## 2018-02-07 MED FILL — MIBELAS 24 FE CHEWABLE TAB: 1-20 | 84 days supply | Qty: 84 | Fill #2

## 2018-04-28 MED FILL — MIBELAS 24 FE CHEWABLE TAB: 1-20 | 84 days supply | Qty: 84 | Fill #3

## 2018-05-05 ENCOUNTER — Ambulatory Visit: Payer: Self-pay | Admitting: Nurse Practitioner

## 2018-05-05 ENCOUNTER — Encounter: Payer: Self-pay | Admitting: Nurse Practitioner

## 2018-05-05 VITALS — BP 110/82 | HR 74 | Temp 98.0°F | Wt 219.0 lb

## 2018-05-05 DIAGNOSIS — H6982 Other specified disorders of Eustachian tube, left ear: Secondary | ICD-10-CM

## 2018-05-05 MED ORDER — PREDNISONE 10 MG (21) PO TBPK: ORAL_TABLET | ORAL | 0 refills | Status: AC

## 2018-05-05 NOTE — Progress Notes (Addendum)
Subjective:    Patient ID: Mackenzie Holland, female    DOB: [birth year], 37 y.o.   MRN: [id number]  The patient is a 37 year old female who presents today for complaints of left ear pain.  The patient states symptoms started about 2 weeks ago.  The patient denies fever, cough, chills, sinus pressure, or nasal congestion.  The patient does complain of a headache with her ear pain.  Patient states her current ear pain rates 4/10 at present.  The patient further denies any loss of hearing, change in hearing, or difficulty hearing.  The patient has been taking ibuprofen for her symptoms with some relief.  Otalgia   There is pain in the left ear. This is a new problem. The current episode started 1 to 4 weeks ago. The problem occurs every few minutes. The problem has been gradually worsening. There has been no fever. The pain is at a severity of 4/10. The pain is mild. Associated symptoms include headaches. Pertinent negatives include no coughing, ear discharge, rhinorrhea or sore throat. She has tried NSAIDs for the symptoms. The treatment provided mild relief.   I have reviewed the patient's past medical history, current medications, and allergies.   Review of Systems  Constitutional: Negative.   HENT: Positive for ear pain. Negative for ear discharge, rhinorrhea, sinus pressure, sinus pain and sore throat.   Eyes: Negative.   Respiratory: Negative for cough.   Cardiovascular: Negative.   Gastrointestinal: Negative.   Skin: Negative.   Neurological: Positive for headaches.      Objective:   Physical Exam  Constitutional: She is oriented to person, place, and time. She appears well-developed and well-nourished. No distress.  HENT:  Head: Normocephalic and atraumatic.  Right Ear: External ear normal.  Left Ear: External ear normal.  Left ear canal normal, left TM is opaque, no erythema, + middle ear mucoid fluid.  Right ear canal is normal, right TM is opaque, able to visualize  landmarks, no erythema or bulging.  Neck: Normal range of motion. Neck supple. No tracheal deviation present. No thyromegaly present.  Cardiovascular: Normal rate, regular rhythm and normal heart sounds.  Pulmonary/Chest: Effort normal and breath sounds normal.  Abdominal: Soft. Bowel sounds are normal.  Neurological: She is alert and oriented to person, place, and time.  Skin: Skin is warm.  Psychiatric: She has a normal mood and affect. Her behavior is normal.  Vitals reviewed.     Assessment & Plan:  Exam findings, diagnosis etiology and medication use and indications reviewed with patient. Follow- Up and discharge instructions provided. No emergent/urgent issues found on exam. Patient education was provided. Patient verbalized understanding of information provided and agrees with plan of care (POC), all questions answered. The patient is advised to call or return to clinic if condition does not see an improvement in symptoms, or to seek the care of the closest emergency department if condition worsens with the above plan.   1. Acute dysfunction of left eustachian tube  - predniSONE (STERAPRED UNI-PAK 21 TAB) 10 MG (21) TBPK tablet; Take as directed.  Dispense: 21 tablet; Refill: 0 -Take medication as prescribed. -Tylenol extra strength 500 mg 1 to 2 tablets every 6-8 hours as needed for pain or discomfort. -Benadryl 25 mg at bedtime until symptoms improve. -Warm compresses to the left ear for discomfort. -Follow-up with your PCP if you develop loss of hearing change of hearing or other concerns. -Follow-up in our office if symptoms do not improve within the next 7  to 10 days.

## 2018-05-05 NOTE — Patient Instructions (Signed)
Eustachian Tube Dysfunction -Take medication as prescribed. -Tylenol extra strength 500 mg 1 to 2 tablets every 6-8 hours as needed for pain or discomfort. -Benadryl 25 mg at bedtime until symptoms improve. -Warm compresses to the left ear for discomfort. -Follow-up with your PCP if you develop loss of hearing change of hearing or other concerns. -Follow-up in our office if symptoms do not improve within the next 7 to 10 days.  The eustachian tube connects the middle ear to the back of the nose. It regulates air pressure in the middle ear by allowing air to move between the ear and nose. It also helps to drain fluid from the middle ear space. When the eustachian tube does not function properly, air pressure, fluid, or both can build up in the middle ear. Eustachian tube dysfunction can affect one or both ears. What are the causes? This condition happens when the eustachian tube becomes blocked or cannot open normally. This may result from:  Ear infections.  Colds and other upper respiratory infections.  Allergies.  Irritation, such as from cigarette smoke or acid from the stomach coming up into the esophagus (gastroesophageal reflux).  Sudden changes in air pressure, such as from descending in an airplane.  Abnormal growths in the nose or throat, such as nasal polyps, tumors, or enlarged tissue at the back of the throat (adenoids).  What increases the risk? This condition may be more likely to develop in people who smoke and people who are overweight. Eustachian tube dysfunction may also be more likely to develop in children, especially children who have:  Certain birth defects of the mouth, such as cleft palate.  Large tonsils and adenoids.  What are the signs or symptoms? Symptoms of this condition may include:  A feeling of fullness in the ear.  Ear pain.  Clicking or popping noises in the ear.  Ringing in the ear.  Hearing loss.  Loss of balance.  Symptoms may get  worse when the air pressure around you changes, such as when you travel to an area of high elevation or fly on an airplane. How is this diagnosed? This condition may be diagnosed based on:  Your symptoms.  A physical exam of your ear, nose, and throat.  Tests, such as those that measure: ? The movement of your eardrum (tympanogram). ? Your hearing (audiometry).  How is this treated? Treatment depends on the cause and severity of your condition. If your symptoms are mild, you may be able to relieve your symptoms by moving air into ("popping") your ears. If you have symptoms of fluid in your ears, treatment may include:  Decongestants.  Antihistamines.  Nasal sprays or ear drops that contain medicines that reduce swelling (steroids).  In some cases, you may need to have a procedure to drain the fluid in your eardrum (myringotomy). In this procedure, a small tube is placed in the eardrum to:  Drain the fluid.  Restore the air in the middle ear space.  Follow these instructions at home:  Take over-the-counter and prescription medicines only as told by your health care provider.  Use techniques to help pop your ears as recommended by your health care provider. These may include: ? Chewing gum. ? Yawning. ? Frequent, forceful swallowing. ? Closing your mouth, holding your nose closed, and gently blowing as if you are trying to blow air out of your nose.  Do not do any of the following until your health care provider approves: ? Travel to high altitudes. ?  Fly in airplanes. ? Work in a Pension scheme manager or room. ? Scuba dive.  Keep your ears dry. Dry your ears completely after showering or bathing.  Do not smoke.  Keep all follow-up visits as told by your health care provider. This is important. Contact a health care provider if:  Your symptoms do not go away after treatment.  Your symptoms come back after treatment.  You are unable to pop your ears.  You have: ? A  fever. ? Pain in your ear. ? Pain in your head or neck. ? Fluid draining from your ear.  Your hearing suddenly changes.  You become very dizzy.  You lose your balance. This information is not intended to replace advice given to you by your health care provider. Make sure you discuss any questions you have with your health care provider. Document Released: 07/12/2015 Document Revised: 11/21/2015 Document Reviewed: 07/04/2014 Elsevier Interactive Patient Education  [employer].

## 2018-06-17 DIAGNOSIS — H52221 Regular astigmatism, right eye: Secondary | ICD-10-CM | POA: Diagnosis not present

## 2018-06-17 DIAGNOSIS — H5213 Myopia, bilateral: Secondary | ICD-10-CM | POA: Diagnosis not present

## 2018-08-01 MED FILL — MIBELAS 24 FE CHEWABLE TAB: 1-20 | 84 days supply | Qty: 84 | Fill #0

## 2018-11-17 DIAGNOSIS — Z01419 Encounter for gynecological examination (general) (routine) without abnormal findings: Secondary | ICD-10-CM | POA: Diagnosis not present

## 2018-11-17 DIAGNOSIS — Z3041 Encounter for surveillance of contraceptive pills: Secondary | ICD-10-CM | POA: Diagnosis not present

## 2018-11-17 DIAGNOSIS — Z124 Encounter for screening for malignant neoplasm of cervix: Secondary | ICD-10-CM | POA: Diagnosis not present

## 2018-11-17 DIAGNOSIS — Z130 Encounter for screening for diseases of the blood and blood-forming organs and certain disorders involving the immune mechanism: Secondary | ICD-10-CM | POA: Diagnosis not present

## 2018-11-17 MED FILL — NORETHIN ACE-ETH ESTRAD-FE: 1-20 | 84 days supply | Qty: 84 | Fill #0

## 2018-12-08 ENCOUNTER — Encounter: Payer: Self-pay | Admitting: Physician Assistant

## 2018-12-08 ENCOUNTER — Ambulatory Visit (INDEPENDENT_AMBULATORY_CARE_PROVIDER_SITE_OTHER): Payer: 59 | Admitting: Physician Assistant

## 2018-12-08 VITALS — BP 152/98 | HR 81 | Temp 98.6°F | Resp 14 | Ht 68.0 in | Wt 226.0 lb

## 2018-12-08 DIAGNOSIS — Z130 Encounter for screening for diseases of the blood and blood-forming organs and certain disorders involving the immune mechanism: Secondary | ICD-10-CM | POA: Diagnosis not present

## 2018-12-08 DIAGNOSIS — Z7689 Persons encountering health services in other specified circumstances: Secondary | ICD-10-CM

## 2018-12-08 DIAGNOSIS — H9202 Otalgia, left ear: Secondary | ICD-10-CM

## 2018-12-08 DIAGNOSIS — Z13220 Encounter for screening for lipoid disorders: Secondary | ICD-10-CM | POA: Diagnosis not present

## 2018-12-08 DIAGNOSIS — I10 Essential (primary) hypertension: Secondary | ICD-10-CM | POA: Diagnosis not present

## 2018-12-08 DIAGNOSIS — Z1389 Encounter for screening for other disorder: Secondary | ICD-10-CM

## 2018-12-08 DIAGNOSIS — Z9289 Personal history of other medical treatment: Secondary | ICD-10-CM

## 2018-12-08 DIAGNOSIS — J019 Acute sinusitis, unspecified: Secondary | ICD-10-CM | POA: Diagnosis not present

## 2018-12-08 DIAGNOSIS — Z1329 Encounter for screening for other suspected endocrine disorder: Secondary | ICD-10-CM

## 2018-12-08 MED ORDER — FLUTICASONE PROPIONATE 50 MCG/ACT NA SUSP: 1.0000 | Freq: Every day | NASAL | 1 refills | Status: DC

## 2018-12-08 MED ORDER — AMOXICILLIN-POT CLAVULANATE 875-125 MG PO TABS: 1.0000 | ORAL_TABLET | Freq: Two times a day (BID) | ORAL | 0 refills | Status: AC

## 2018-12-08 MED FILL — FLUTICASONE PROP 50 MCG SPR: 50 | 60 days supply | Qty: 16 | Fill #0

## 2018-12-08 MED FILL — AMOX-CLAV 875-125 MG TABLET: 875-125 | 14 days supply | Qty: 28 | Fill #0

## 2018-12-08 NOTE — Patient Instructions (Addendum)
For your blood pressure: - Goal <130/80 (Ideally 120's/70's) - stop birth control and use condoms. Discuss non-hormonal birth control with OB - monitor and log blood pressures at home - check around the same time each day in a relaxed setting - Limit salt to <2500 mg/day - Follow DASH (Dietary Approach to Stopping Hypertension) eating plan - Try to get at least 150 minutes of aerobic exercise per week - Aim to go on a brisk walk 30 minutes per day at least 5 days per week. If you're not active, gradually increase how long you walk by 5 minutes each week - limit alcohol: 2 standard drinks per day for men and 1 per day for women - avoid tobacco/nicotine products. Consider smoking cessation if you smoke - weight loss: 7% of current body weight can reduce your blood pressure by 5-10 points - follow-up at least every 6 months for your blood pressure. Follow-up sooner if your BP is not controlled   Earache, Adult An earache, or ear pain, can be caused by many things, including:  An infection.  Ear wax buildup.  Ear pressure.  Something in the ear that should not be there (foreign body).  A sore throat.  Tooth problems.  Jaw problems. Treatment of the earache will depend on the cause. If the cause is not clear or cannot be determined, you may need to watch your symptoms until your earache goes away or until a cause is found. Follow these instructions at home: Pay attention to any changes in your symptoms. Take these actions to help with your pain:  Take or apply over-the-counter and prescription medicines only as told by your health care provider.  If you were prescribed an antibiotic medicine, use it as told by your health care provider. Do not stop using the antibiotic even if you start to feel better.  Do not put anything in your ear other than medicine that is prescribed by your health care provider.  If directed, apply heat to the affected area as often as told by your health  care provider. Use the heat source that your health care provider recommends, such as a moist heat pack or a heating pad. ? Place a towel between your skin and the heat source. ? Leave the heat on for 20-30 minutes. ? Remove the heat if your skin turns bright red. This is especially important if you are unable to feel pain, heat, or cold. You may have a greater risk of getting burned.  If directed, put ice on the ear: ? Put ice in a plastic bag. ? Place a towel between your skin and the bag. ? Leave the ice on for 20 minutes, 2-3 times a day.  Try resting in an upright position instead of lying down. This may help to reduce pressure in your ear and relieve pain.  Chew gum if it helps to relieve your ear pain.  Treat any allergies as told by your health care provider.  Keep all follow-up visits as told by your health care provider. This is important. Contact a health care provider if:  Your pain does not improve within 2 days.  Your earache gets worse.  You have new symptoms.  You have a fever. Get help right away if:  You have a severe headache.  You have a stiff neck.  You have trouble swallowing.  You have redness or swelling behind your ear.  You have fluid or blood coming from your ear.  You have hearing loss.  You feel dizzy. This information is not intended to replace advice given to you by your health care provider. Make sure you discuss any questions you have with your health care provider. Document Released: 01/31/2004 Document Revised: 02/11/2016 Document Reviewed: 12/09/2015 Elsevier Interactive Patient Education  [employer].

## 2018-12-08 NOTE — Progress Notes (Signed)
HPI:                                                                Mackenzie Holland is a 38 y.o. female who presents to [facility]: [facility] today to establish care  Current concerns: blood pressure, otalgia  Referred here today by her OB for elevated blood pressure readings.  Patient does not self monitor blood pressure at home.  She is a Equities trader.  She denies any history of preeclampsia or gestational hypertension. She currently takes OCP for contraception.  She endorses occasional heart palpitations and mild headaches.  Denies exertional chest pain, peripheral edema, dizziness/lightheadedness, near-syncope or syncope. She reports that she has had an echocardiogram in the past for dyspnea.  Echocardiogram dated October 15, 2015 shows a normal left ventricle with EF of 55 to 60% with normal wall motion  She is also concerned about chronic left ear pain that has been waxing and waning for approximately a year.  She states she has been treated with steroids multiple times without much improvement.  She endorses recurrent swelling in her right preauricular lymph nodes, most recently this occurred 2 weeks ago.  She denies any hearing loss, fever, sinus pressure, nasal congestion, cough.  She has a history of adenoidectomy in 2004 for recurrent sinusitis.   Depression screen PHQ 2/9 12/08/2018  Decreased Interest 1  Down, Depressed, Hopeless 1  PHQ - 2 Score 2  Altered sleeping 0  Tired, decreased energy 1  Change in appetite 0  Feeling bad or failure about yourself  1  Trouble concentrating 0  Moving slowly or fidgety/restless 0  Suicidal thoughts 0  PHQ-9 Score 4    GAD 7 : Generalized Anxiety Score 12/08/2018  Nervous, Anxious, on Edge 1  Control/stop worrying 1  Worry too much - different things 1  Trouble relaxing 1  Restless 0  Easily annoyed or irritable 2  Afraid - awful might happen 0  Total GAD 7 Score 6      Past Medical  History:  Diagnosis Date  . Borderline hypercholesterolemia 12/11/2018  . GERD (gastroesophageal reflux disease)   . Hypertension   . Migraine   . Recurrent sinus infections    Past Surgical History:  Procedure Laterality Date  . ADENOIDECTOMY    . DILATION AND CURETTAGE OF UTERUS    . LEG SURGERY Left 1998   Plastic Surg   Social History   Tobacco Use  . Smoking status: Former Smoker    Types: Cigarettes    Quit date: 12/27/2006    Years since quitting: 12.0  . Smokeless tobacco: Never Used  . Tobacco comment: hx of light tobacco use socially  Substance Use Topics  . Alcohol use: Yes    Alcohol/week: 1.0 standard drinks    Types: 1 Standard drinks or equivalent per week   family history includes Hypertension in her father; Skin cancer in her maternal grandfather.    Review of Systems  HENT: Positive for ear pain. Facial swelling: left, chronic.   Cardiovascular: Positive for palpitations.  Neurological: Positive for headaches.  Hematological: Positive for adenopathy (right preauricular).  Psychiatric/Behavioral: Positive for dysphoric mood (mood swings). The patient is nervous/anxious.     Medications: Current Outpatient Medications  Medication Sig Dispense Refill  . fluticasone (FLONASE) 50 MCG/ACT nasal spray Place 1 spray into both nostrils daily. 16 g 1   No current facility-administered medications for this visit.    No Known Allergies     Objective:  BP (!) 152/98   Pulse 81   Temp 98.6 F (37 C) (Oral)   Resp 14   Ht 5\' 8"  (1.727 m)   Wt 226 lb (102.5 kg)   LMP 11/19/2018 (Exact Date)   Breastfeeding No   BMI 34.36 kg/m  Vitals:   12/08/18 1052 12/08/18 1122  BP: (!) 150/102 (!) 152/98  Pulse: 81   Resp: 14   Temp: 98.6 F (37 C)     Gen:  alert, not ill-appearing, no distress, appropriate for age 20: head normocephalic without obvious abnormality, conjunctiva and cornea clear, TM's pearly gray and semitransparent bilaterally,  hearing intact to finger rub, no pre-/post-auricular adenopathy, no cervical adenopathy, trachea midline Pulm: Normal work of breathing, normal phonation, clear to auscultation bilaterally, no wheezes, rales or rhonchi CV: Normal rate, regular rhythm, s1 and s2 distinct, no murmurs, clicks or rubs  Neuro: alert and oriented x 3, no tremor MSK: extremities atraumatic, normal gait and station, no peripheral edema Skin: intact, no rashes on exposed skin, no jaundice, no cyanosis Psych: well-groomed, cooperative, good eye contact, euthymic mood, affect mood-congruent, speech is articulate, and thought processes clear and goal-directed    No results found for this or any previous visit (from the past 72 hour(s)). No results found.    Assessment and Plan: 38 y.o. female with   .Mackenzie Holland was seen today for establish care.  Diagnoses and all orders for this visit:  Encounter to establish care  Hypertension goal BP (blood pressure) < 130/80 -     COMPLETE METABOLIC PANEL WITH GFR -     CBC -     TSH + free T4 -     Urinalysis, Routine w reflex microscopic -     Lipid Profile  History of echocardiogram Comments: 2017 -EF 55 to 60%, no wall motion abnormality, normal study  Subacute sinusitis, unspecified location -     amoxicillin-clavulanate (AUGMENTIN) 875-125 MG tablet; Take 1 tablet by mouth 2 (two) times daily for 14 days. -     fluticasone (FLONASE) 50 MCG/ACT nasal spray; Place 1 spray into both nostrils daily.  Screening for blood disease -     COMPLETE METABOLIC PANEL WITH GFR -     CBC  Screening for thyroid disorder -     TSH + free T4  Screening for blood or protein in urine -     Urinalysis, Routine w reflex microscopic  Screening for lipid disorders -     Lipid Profile   - Personally reviewed PMH, PSH, PFH, medications, allergies, HM - Age-appropriate cancer screening: Pap UTD per patient 11/17/18 (record requested from GYN) - Tdap UTD - PHQ2=2,  PHQ9=4   HTN BP in stage 2 hypertensive range in office today on 2 checks Declined antihypertensive medication She will stop OCP/use back-up contraception and monitor BP at home She will discuss non-hormonal birth control with her OB Counseled on therapeutic lifestyle changes CMP, TSH, UA, lipids pending  Chronic left-sided otalgia Benign ear exam, hearing intact. I believe this is referred ear pain. DDx includes eustachian tube dysfunction and sinusitis Due to her hx of adenoidectomy/recurrent sinusitis will treat for subacute sinusitis with 2 week course of Augmentin  Continue on Flonase   Patient education and anticipatory  guidance given Patient agrees with treatment plan Follow-up in 2 weeks or sooner as needed if symptoms worsen or fail to improve  Darlyne Russian PA-C

## 2018-12-09 ENCOUNTER — Encounter: Payer: Self-pay | Admitting: Physician Assistant

## 2018-12-09 LAB — COMPLETE METABOLIC PANEL WITH GFR
AG Ratio: 1.5 (calc) (ref 1.0–2.5)
ALT: 18 U/L (ref 6–29)
AST: 14 U/L (ref 10–30)
Albumin: 4 g/dL (ref 3.6–5.1)
Alkaline phosphatase (APISO): 41 U/L (ref 31–125)
BUN: 11 mg/dL (ref 7–25)
CO2: 24 mmol/L (ref 20–32)
Calcium: 9 mg/dL (ref 8.6–10.2)
Chloride: 105 mmol/L (ref 98–110)
Creat: 0.89 mg/dL (ref 0.50–1.10)
GFR, Est African American: 97 mL/min/{1.73_m2} (ref >=60)
GFR, Est Non African American: 83 mL/min/{1.73_m2} (ref >=60)
Globulin: 2.7 g/dL (calc) (ref 1.9–3.7)
Glucose, Bld: 88 mg/dL (ref 65–99)
Potassium: 4.1 mmol/L (ref 3.5–5.3)
Sodium: 139 mmol/L (ref 135–146)
Total Bilirubin: 0.4 mg/dL (ref 0.2–1.2)
Total Protein: 6.7 g/dL (ref 6.1–8.1)

## 2018-12-09 LAB — CBC
HCT: 41.8 % (ref 35.0–45.0)
Hemoglobin: 14.4 g/dL (ref 11.7–15.5)
MCH: 30.2 pg (ref 27.0–33.0)
MCHC: 34.4 g/dL (ref 32.0–36.0)
MCV: 87.6 fL (ref 80.0–100.0)
MPV: 12.5 fL (ref 7.5–12.5)
Platelets: 204 10*3/uL (ref 140–400)
RBC: 4.77 10*6/uL (ref 3.80–5.10)
RDW: 13 % (ref 11.0–15.0)
WBC: 7.6 10*3/uL (ref 3.8–10.8)

## 2018-12-09 LAB — LIPID PANEL
Cholesterol: 219 mg/dL — ABNORMAL HIGH (ref <200)
HDL: 74 mg/dL (ref >=50)
LDL Cholesterol (Calc): 123 mg/dL (calc) — ABNORMAL HIGH
Non-HDL Cholesterol (Calc): 145 mg/dL (calc) — ABNORMAL HIGH (ref <130)
Total CHOL/HDL Ratio: 3 (calc) (ref <5.0)
Triglycerides: 111 mg/dL (ref <150)

## 2018-12-09 LAB — URINALYSIS, ROUTINE W REFLEX MICROSCOPIC
Bilirubin Urine: NEGATIVE
Glucose, UA: NEGATIVE
Hgb urine dipstick: NEGATIVE
Ketones, ur: NEGATIVE
Leukocytes,Ua: NEGATIVE
Nitrite: NEGATIVE
Protein, ur: NEGATIVE
Specific Gravity, Urine: 1.026 (ref 1.001–1.03)
pH: 6 (ref 5.0–8.0)

## 2018-12-09 LAB — TSH+FREE T4: TSH W/REFLEX TO FT4: 1.32 mIU/L

## 2018-12-11 ENCOUNTER — Encounter: Payer: Self-pay | Admitting: Physician Assistant

## 2018-12-11 DIAGNOSIS — E7800 Pure hypercholesterolemia, unspecified: Secondary | ICD-10-CM

## 2018-12-11 HISTORY — DX: Pure hypercholesterolemia, unspecified: E78.00

## 2018-12-20 DIAGNOSIS — Z7689 Persons encountering health services in other specified circumstances: Secondary | ICD-10-CM | POA: Diagnosis not present

## 2018-12-20 DIAGNOSIS — I159 Secondary hypertension, unspecified: Secondary | ICD-10-CM | POA: Diagnosis not present

## 2018-12-20 DIAGNOSIS — F330 Major depressive disorder, recurrent, mild: Secondary | ICD-10-CM | POA: Diagnosis not present

## 2018-12-20 DIAGNOSIS — F411 Generalized anxiety disorder: Secondary | ICD-10-CM | POA: Diagnosis not present

## 2018-12-20 MED FILL — ESCITALOPRAM 10 MG TABLET: 10 | 90 days supply | Qty: 90 | Fill #0

## 2018-12-20 MED FILL — LISINOPRIL 10 MG TABLET: 10 | 90 days supply | Qty: 90 | Fill #0

## 2018-12-22 ENCOUNTER — Ambulatory Visit: Payer: 59 | Admitting: Physician Assistant

## 2019-01-01 ENCOUNTER — Encounter: Payer: Self-pay | Admitting: Physician Assistant

## 2019-01-01 DIAGNOSIS — Z9289 Personal history of other medical treatment: Secondary | ICD-10-CM | POA: Insufficient documentation

## 2019-01-01 DIAGNOSIS — I10 Essential (primary) hypertension: Secondary | ICD-10-CM | POA: Insufficient documentation

## 2019-01-01 DIAGNOSIS — J019 Acute sinusitis, unspecified: Secondary | ICD-10-CM | POA: Insufficient documentation

## 2019-01-09 DIAGNOSIS — F330 Major depressive disorder, recurrent, mild: Secondary | ICD-10-CM | POA: Diagnosis not present

## 2019-01-09 DIAGNOSIS — I159 Secondary hypertension, unspecified: Secondary | ICD-10-CM | POA: Diagnosis not present

## 2019-01-09 DIAGNOSIS — Z79899 Other long term (current) drug therapy: Secondary | ICD-10-CM | POA: Diagnosis not present

## 2019-01-09 DIAGNOSIS — F411 Generalized anxiety disorder: Secondary | ICD-10-CM | POA: Diagnosis not present

## 2019-01-09 DIAGNOSIS — G479 Sleep disorder, unspecified: Secondary | ICD-10-CM | POA: Diagnosis not present

## 2019-01-23 MED FILL — NORETHIN ACE-ETH ESTRAD-FE: 1-20 | 84 days supply | Qty: 84 | Fill #0

## 2019-02-13 DIAGNOSIS — F411 Generalized anxiety disorder: Secondary | ICD-10-CM | POA: Diagnosis not present

## 2019-02-13 DIAGNOSIS — G479 Sleep disorder, unspecified: Secondary | ICD-10-CM | POA: Diagnosis not present

## 2019-02-13 DIAGNOSIS — I159 Secondary hypertension, unspecified: Secondary | ICD-10-CM | POA: Diagnosis not present

## 2019-02-13 DIAGNOSIS — F330 Major depressive disorder, recurrent, mild: Secondary | ICD-10-CM | POA: Diagnosis not present

## 2019-02-13 MED FILL — ESCITALOPRAM 20 MG TABLET: 20 | 90 days supply | Qty: 90 | Fill #0

## 2019-02-13 MED FILL — busPIRone HCL 10 MG TABS: 10 | 30 days supply | Qty: 60 | Fill #0

## 2019-03-22 MED FILL — LISINOPRIL 10 MG TABS: 10 | 90 days supply | Qty: 90 | Fill #0

## 2019-04-25 MED FILL — NORETHIN ACE-ETH ESTRAD-FE: 1-20 | 84 days supply | Qty: 84 | Fill #1

## 2019-05-03 DIAGNOSIS — R52 Pain, unspecified: Secondary | ICD-10-CM | POA: Diagnosis not present

## 2019-05-03 DIAGNOSIS — R5383 Other fatigue: Secondary | ICD-10-CM | POA: Diagnosis not present

## 2019-05-03 DIAGNOSIS — Z20828 Contact with and (suspected) exposure to other viral communicable diseases: Secondary | ICD-10-CM | POA: Diagnosis not present

## 2019-05-03 DIAGNOSIS — R05 Cough: Secondary | ICD-10-CM | POA: Diagnosis not present

## 2019-05-12 MED FILL — ESCITALOPRAM 20 MG TABLET: 20 | 90 days supply | Qty: 90 | Fill #1

## 2019-05-12 MED FILL — busPIRone HCL 10 MG TABS: 10 | 30 days supply | Qty: 60 | Fill #1

## 2019-06-22 DIAGNOSIS — I159 Secondary hypertension, unspecified: Secondary | ICD-10-CM | POA: Diagnosis not present

## 2019-06-22 DIAGNOSIS — T887XXA Unspecified adverse effect of drug or medicament, initial encounter: Secondary | ICD-10-CM | POA: Diagnosis not present

## 2019-06-22 DIAGNOSIS — F411 Generalized anxiety disorder: Secondary | ICD-10-CM | POA: Diagnosis not present

## 2019-06-22 DIAGNOSIS — F330 Major depressive disorder, recurrent, mild: Secondary | ICD-10-CM | POA: Diagnosis not present

## 2019-06-22 DIAGNOSIS — Z79899 Other long term (current) drug therapy: Secondary | ICD-10-CM | POA: Diagnosis not present

## 2019-06-22 MED FILL — busPIRone HCL 10 MG TABS: 10 | 90 days supply | Qty: 180 | Fill #0

## 2019-06-22 MED FILL — LOSARTAN POTASSIUM 25 MG TA: 25 | 90 days supply | Qty: 90 | Fill #0

## 2019-07-07 MED FILL — NORETHIN ACE-ETH ESTRAD-FE: 1-20 | 84 days supply | Qty: 84 | Fill #2

## 2019-08-15 MED FILL — ESCITALOPRAM 20 MG TABLET: 20 | 90 days supply | Qty: 90 | Fill #0

## 2019-09-21 MED FILL — LOSARTAN POTASSIUM 25 MG TA: 25 | 90 days supply | Qty: 90 | Fill #0

## 2019-10-12 MED FILL — MELODETTA 24 FE CHEWABLE TA: 1-20 | 84 days supply | Qty: 84 | Fill #3

## 2019-11-08 DIAGNOSIS — F330 Major depressive disorder, recurrent, mild: Secondary | ICD-10-CM | POA: Diagnosis not present

## 2019-11-08 DIAGNOSIS — I159 Secondary hypertension, unspecified: Secondary | ICD-10-CM | POA: Diagnosis not present

## 2019-11-08 DIAGNOSIS — F411 Generalized anxiety disorder: Secondary | ICD-10-CM | POA: Diagnosis not present

## 2019-11-22 ENCOUNTER — Other Ambulatory Visit (HOSPITAL_COMMUNITY): Payer: Self-pay | Admitting: Obstetrics and Gynecology

## 2019-11-22 DIAGNOSIS — Z01419 Encounter for gynecological examination (general) (routine) without abnormal findings: Secondary | ICD-10-CM | POA: Diagnosis not present

## 2019-11-22 DIAGNOSIS — E669 Obesity, unspecified: Secondary | ICD-10-CM | POA: Diagnosis not present

## 2019-11-22 DIAGNOSIS — N841 Polyp of cervix uteri: Secondary | ICD-10-CM | POA: Diagnosis not present

## 2019-11-22 DIAGNOSIS — Z130 Encounter for screening for diseases of the blood and blood-forming organs and certain disorders involving the immune mechanism: Secondary | ICD-10-CM | POA: Diagnosis not present

## 2019-11-22 DIAGNOSIS — Z1389 Encounter for screening for other disorder: Secondary | ICD-10-CM | POA: Diagnosis not present

## 2019-11-22 DIAGNOSIS — Z3041 Encounter for surveillance of contraceptive pills: Secondary | ICD-10-CM | POA: Diagnosis not present

## 2019-12-18 MED FILL — busPIRone HCL 10 MG TABS: 10 | 90 days supply | Qty: 180 | Fill #0

## 2019-12-18 MED FILL — LOSARTAN POTASSIUM 25 MG TA: 25 | 90 days supply | Qty: 90 | Fill #0

## 2020-01-15 MED FILL — NORETHIN ACE-ETH ESTRAD-FE: 1-20 | 84 days supply | Qty: 84 | Fill #0

## 2020-02-12 MED FILL — ESCITALOPRAM 20 MG TABLET: 20 | 90 days supply | Qty: 90 | Fill #0

## 2020-03-22 MED FILL — NORETHIN ACE-ETH ESTRAD-FE: 1-20 | 84 days supply | Qty: 84 | Fill #1

## 2020-03-22 MED FILL — LOSARTAN POTASSIUM 25 MG TA: 25 | 90 days supply | Qty: 90 | Fill #0

## 2020-04-04 ENCOUNTER — Other Ambulatory Visit (HOSPITAL_BASED_OUTPATIENT_CLINIC_OR_DEPARTMENT_OTHER): Payer: Self-pay | Admitting: Primary Care

## 2020-04-04 DIAGNOSIS — Z03818 Encounter for observation for suspected exposure to other biological agents ruled out: Secondary | ICD-10-CM | POA: Diagnosis not present

## 2020-04-04 DIAGNOSIS — Z23 Encounter for immunization: Secondary | ICD-10-CM | POA: Diagnosis not present

## 2020-04-04 DIAGNOSIS — J019 Acute sinusitis, unspecified: Secondary | ICD-10-CM | POA: Diagnosis not present

## 2020-04-04 MED FILL — AMOX-CLAV 875-125 MG TABLET: 875-125 | 10 days supply | Qty: 20 | Fill #0

## 2020-04-12 DIAGNOSIS — Z111 Encounter for screening for respiratory tuberculosis: Secondary | ICD-10-CM | POA: Diagnosis not present

## 2020-05-14 MED FILL — ESCITALOPRAM 20 MG TABLET: 20 | 90 days supply | Qty: 90 | Fill #0

## 2020-05-29 ENCOUNTER — Other Ambulatory Visit (HOSPITAL_COMMUNITY): Payer: Self-pay | Admitting: Nurse Practitioner

## 2020-05-29 DIAGNOSIS — I159 Secondary hypertension, unspecified: Secondary | ICD-10-CM | POA: Diagnosis not present

## 2020-05-29 DIAGNOSIS — F411 Generalized anxiety disorder: Secondary | ICD-10-CM | POA: Diagnosis not present

## 2020-05-29 DIAGNOSIS — M898X9 Other specified disorders of bone, unspecified site: Secondary | ICD-10-CM | POA: Diagnosis not present

## 2020-05-29 MED FILL — busPIRone HCL 10 MG TABS: 10 | 90 days supply | Qty: 180 | Fill #0

## 2020-05-30 ENCOUNTER — Other Ambulatory Visit (HOSPITAL_COMMUNITY): Payer: Self-pay | Admitting: Nurse Practitioner

## 2020-05-30 MED FILL — VIT D2 1.25 MG (50,000 UNIT: 1.25 MG | 84 days supply | Qty: 12 | Fill #0

## 2020-06-03 MED FILL — LOSARTAN POTASSIUM 25 MG TA: 25 | 90 days supply | Qty: 90 | Fill #0

## 2020-06-10 MED FILL — NORETHIN ACE-ETH ESTRAD-FE: 1-20 | 84 days supply | Qty: 84 | Fill #2

## 2020-07-17 ENCOUNTER — Other Ambulatory Visit (HOSPITAL_COMMUNITY): Payer: Self-pay | Admitting: Nurse Practitioner

## 2020-07-17 MED FILL — PHENTERMINE 37.5 MG TABLET: 37.5 | 30 days supply | Qty: 30 | Fill #0

## 2020-08-13 MED FILL — ESCITALOPRAM 20 MG TABLET: 20 | 90 days supply | Qty: 90 | Fill #0

## 2020-08-14 ENCOUNTER — Other Ambulatory Visit (HOSPITAL_COMMUNITY): Payer: Self-pay | Admitting: Nurse Practitioner

## 2020-08-14 DIAGNOSIS — E669 Obesity, unspecified: Secondary | ICD-10-CM | POA: Diagnosis not present

## 2020-08-14 DIAGNOSIS — E559 Vitamin D deficiency, unspecified: Secondary | ICD-10-CM | POA: Diagnosis not present

## 2020-08-14 DIAGNOSIS — R41840 Attention and concentration deficit: Secondary | ICD-10-CM | POA: Diagnosis not present

## 2020-08-14 MED FILL — PHENTERMINE 37.5 MG CAPSULE: 37.5 | 30 days supply | Qty: 30 | Fill #0

## 2020-09-19 ENCOUNTER — Other Ambulatory Visit (HOSPITAL_BASED_OUTPATIENT_CLINIC_OR_DEPARTMENT_OTHER): Payer: Self-pay

## 2020-09-27 ENCOUNTER — Other Ambulatory Visit (HOSPITAL_BASED_OUTPATIENT_CLINIC_OR_DEPARTMENT_OTHER): Payer: Self-pay | Admitting: Orthopedic Surgery

## 2020-09-27 DIAGNOSIS — M25551 Pain in right hip: Secondary | ICD-10-CM | POA: Diagnosis not present

## 2020-09-27 DIAGNOSIS — M25552 Pain in left hip: Secondary | ICD-10-CM | POA: Diagnosis not present

## 2020-09-27 MED FILL — DICLOFENAC SODIUM 75 MG TAB: 75 | 30 days supply | Qty: 60 | Fill #0

## 2020-09-30 DIAGNOSIS — Z111 Encounter for screening for respiratory tuberculosis: Secondary | ICD-10-CM | POA: Diagnosis not present

## 2020-10-18 DIAGNOSIS — I159 Secondary hypertension, unspecified: Secondary | ICD-10-CM | POA: Diagnosis not present

## 2020-10-18 DIAGNOSIS — Z0000 Encounter for general adult medical examination without abnormal findings: Secondary | ICD-10-CM | POA: Diagnosis not present

## 2020-11-08 DIAGNOSIS — E669 Obesity, unspecified: Secondary | ICD-10-CM | POA: Diagnosis not present

## 2020-11-08 DIAGNOSIS — I10 Essential (primary) hypertension: Secondary | ICD-10-CM | POA: Diagnosis not present

## 2020-11-12 ENCOUNTER — Other Ambulatory Visit (HOSPITAL_COMMUNITY): Payer: Self-pay

## 2020-11-12 MED FILL — Escitalopram Oxalate Tab 20 MG (Base Equiv): ORAL | 90 days supply | Qty: 90 | Fill #0 | Status: AC

## 2020-11-20 ENCOUNTER — Other Ambulatory Visit (HOSPITAL_COMMUNITY): Payer: Self-pay

## 2020-11-20 MED FILL — Diclofenac Sodium Tab Delayed Release 75 MG: ORAL | 30 days supply | Qty: 60 | Fill #0 | Status: AC

## 2020-11-22 ENCOUNTER — Other Ambulatory Visit: Payer: Self-pay | Admitting: General Surgery

## 2020-11-22 DIAGNOSIS — E669 Obesity, unspecified: Secondary | ICD-10-CM

## 2020-12-10 ENCOUNTER — Other Ambulatory Visit (HOSPITAL_COMMUNITY): Payer: 59

## 2020-12-20 ENCOUNTER — Other Ambulatory Visit (HOSPITAL_COMMUNITY): Payer: Self-pay

## 2020-12-20 ENCOUNTER — Other Ambulatory Visit: Payer: Self-pay

## 2020-12-20 MED ORDER — NORETHIN ACE-ETH ESTRAD-FE 1-20 MG-MCG(24) PO CHEW
CHEWABLE_TABLET | ORAL | 0 refills | Status: DC
  Filled 2020-12-20: qty 28, 28d supply, fill #0

## 2020-12-20 MED ORDER — DICLOFENAC SODIUM 75 MG PO TBEC
DELAYED_RELEASE_TABLET | ORAL | 0 refills | Status: DC
  Filled 2020-12-20: qty 60, 30d supply, fill #0

## 2020-12-20 MED ORDER — LOSARTAN POTASSIUM 25 MG PO TABS
ORAL_TABLET | ORAL | 1 refills | Status: DC
  Filled 2020-12-20: qty 90, 90d supply, fill #0

## 2020-12-20 MED ORDER — BUSPIRONE HCL 10 MG PO TABS
ORAL_TABLET | ORAL | 1 refills | Status: AC
  Filled 2020-12-20: qty 180, 90d supply, fill #0
  Filled 2021-03-25: qty 180, 90d supply, fill #1

## 2020-12-25 ENCOUNTER — Ambulatory Visit (HOSPITAL_COMMUNITY)
Admission: RE | Admit: 2020-12-25 | Discharge: 2020-12-25 | Disposition: A | Discharge status: Home / Self Care | Payer: 59 | Source: Ambulatory Visit | Attending: General Surgery | Admitting: General Surgery

## 2020-12-25 ENCOUNTER — Other Ambulatory Visit: Payer: Self-pay | Admitting: General Surgery

## 2020-12-25 ENCOUNTER — Other Ambulatory Visit: Payer: Self-pay

## 2020-12-25 DIAGNOSIS — E669 Obesity, unspecified: Secondary | ICD-10-CM | POA: Insufficient documentation

## 2020-12-25 DIAGNOSIS — Z01818 Encounter for other preprocedural examination: Secondary | ICD-10-CM | POA: Diagnosis not present

## 2021-01-08 ENCOUNTER — Encounter: Payer: Self-pay | Admitting: Skilled Nursing Facility1

## 2021-01-08 ENCOUNTER — Encounter: Payer: 59 | Attending: General Surgery | Admitting: Skilled Nursing Facility1

## 2021-01-08 ENCOUNTER — Other Ambulatory Visit: Payer: Self-pay

## 2021-01-08 DIAGNOSIS — E669 Obesity, unspecified: Secondary | ICD-10-CM | POA: Diagnosis not present

## 2021-01-08 NOTE — Progress Notes (Signed)
Nutrition Assessment for Bariatric Surgery Medical Nutrition Therapy Appt Start Time: 10:02    End Time: 11:02  Patient was seen on 01/08/2021 for Pre-Operative Nutrition Assessment. Letter of approval faxed to [facility] Surgery bariatric surgery program coordinator on 01/08/2021.   Referral stated Supervised Weight Loss (SWL) visits needed: 0  Pt completed visits.   Pt has cleared nutrition requirements.   Planned surgery: sleeve gastrectomy  Pt expectation of surgery: to be healthy Pt expectation of dietitian: none identified     NUTRITION ASSESSMENT   Anthropometrics  Start weight at [facility]: 259.9 lbs (date: 01/08/2021)  Height: 67 in BMI: 40.71 kg/m2     Clinical  Medical hx: anxiety, depression, GERD, HTN, hyperlipidemia  Medications: see list  Labs: vitmain D 59.1 Notable signs/symptoms: migraines once a month, hip dysplasia, occassionally constipation  Any previous deficiencies? No  Micronutrient Nutrition Focused Physical Exam: Hair: No issues observed Eyes: No issues observed Mouth: No issues observed Neck: No issues observed Nails: No issues observed Skin: No issues observed  Lifestyle & Dietary Hx  Pt states she takes pepcid 2 days a week for reflux symptoms.  Pt states she has chickens that she gets fresh eggs from.  Pt states she does keep a garden.  Pt state she is catching bordum eating.  Pt states she wants to work on conducting a chore first thing in the day.  Pt states she actively works on not eating past fullness.   24-Hr Dietary Recall First Meal: 2 shots espresso + protein shake  Snack: banana + greek yogurt + sugar free jello + skinny syrup caramel sometimes with berries Second Meal: wrap + chicken + cheese + hot sauce + lettuce or ranch + rice cakes or tortilla chips + salsa Snack: apple Third Meal: tacos + beef or [place] + refried beans or grilled meat + salad: butter lettuce, tomatoes, cucumbers, carrots, lite dressing  Snack:  cereal  Beverages: coffee, water + flavoring, plain water   Estimated Energy Needs Calories: 1500   NUTRITION DIAGNOSIS  Overweight/obesity (Geronimo-3.3) related to past poor dietary habits and physical inactivity as evidenced by patient w/ planned sleeve gastrectomy surgery following dietary guidelines for continued weight loss.    NUTRITION INTERVENTION  Nutrition counseling (C-1) and education (E-2) to facilitate bariatric surgery goals.  Educated pt on micronutrient deficiencies post surgery and strategies to mitigate that risk   Pre-Op Goals Reviewed with the Patient Track food and beverage intake (pen and paper, MyFitness Pal, Baritastic app, etc.) Make healthy food choices while monitoring portion sizes Consume 3 meals per day or try to eat every 3-5 hours Avoid concentrated sugars and fried foods Keep sugar & fat in the single digits per serving on food labels Practice CHEWING your food (aim for applesauce consistency) Practice not drinking 15 minutes before, during, and 30 minutes after each meal and snack Avoid all carbonated beverages (ex: soda, sparkling beverages)  Limit caffeinated beverages (ex: coffee, tea, energy drinks) Avoid all sugar-sweetened beverages (ex: regular soda, sports drinks)  Avoid alcohol  Aim for 64-100 ounces of FLUID daily (with at least half of fluid intake being plain water)  Aim for at least 60-80 grams of PROTEIN daily Look for a liquid protein source that contains ?15 g protein and ?5 g carbohydrate (ex: shakes, drinks, shots) Make a list of non-food related activities Physical activity is an important part of a healthy lifestyle so keep it moving! The goal is to reach 150 minutes of exercise per week, including cardiovascular  and weight baring activity. Work on bored snacking and overall reduction of this behavior  *Goals that are bolded indicate the pt would like to start working towards these  Handouts Provided Include  Bariatric Surgery  handouts (Nutrition Visits, Pre-Op Goals, Protein Shakes, Vitamins & Minerals)  Learning Style & Readiness for Change Teaching method utilized: Visual & Auditory  Demonstrated degree of understanding via: Teach Back  Readiness Level: action Barriers to learning/adherence to lifestyle change: bored snacking     MONITORING & EVALUATION Dietary intake, weekly physical activity, body weight, and pre-op goals reached at next nutrition visit.    Next Steps  Patient is to follow up at [facility] for Pre-Op Class >2 weeks before surgery for further nutrition education.  Pt has completed visits. No further supervised visits required/recomended

## 2021-01-15 ENCOUNTER — Other Ambulatory Visit (HOSPITAL_COMMUNITY): Payer: Self-pay

## 2021-01-15 MED ORDER — NORETHIN ACE-ETH ESTRAD-FE 1-20 MG-MCG(24) PO CHEW
CHEWABLE_TABLET | ORAL | 0 refills | Status: DC
  Filled 2021-01-15: qty 84, 84d supply, fill #0

## 2021-01-20 DIAGNOSIS — Z01419 Encounter for gynecological examination (general) (routine) without abnormal findings: Secondary | ICD-10-CM | POA: Diagnosis not present

## 2021-01-20 DIAGNOSIS — Z6841 Body Mass Index (BMI) 40.0 and over, adult: Secondary | ICD-10-CM | POA: Diagnosis not present

## 2021-01-20 DIAGNOSIS — Z3009 Encounter for other general counseling and advice on contraception: Secondary | ICD-10-CM | POA: Diagnosis not present

## 2021-01-20 DIAGNOSIS — Z1389 Encounter for screening for other disorder: Secondary | ICD-10-CM | POA: Diagnosis not present

## 2021-01-20 DIAGNOSIS — Z130 Encounter for screening for diseases of the blood and blood-forming organs and certain disorders involving the immune mechanism: Secondary | ICD-10-CM | POA: Diagnosis not present

## 2021-01-26 ENCOUNTER — Other Ambulatory Visit (HOSPITAL_COMMUNITY): Payer: Self-pay

## 2021-01-27 ENCOUNTER — Other Ambulatory Visit (HOSPITAL_COMMUNITY): Payer: Self-pay

## 2021-01-27 MED ORDER — DICLOFENAC SODIUM 75 MG PO TBEC
DELAYED_RELEASE_TABLET | ORAL | 1 refills | Status: AC
  Filled 2021-01-27: qty 60, 30d supply, fill #0

## 2021-02-06 ENCOUNTER — Ambulatory Visit: Payer: Self-pay | Admitting: General Surgery

## 2021-02-10 ENCOUNTER — Other Ambulatory Visit (HOSPITAL_COMMUNITY): Payer: Self-pay

## 2021-02-10 MED ORDER — ESCITALOPRAM OXALATE 20 MG PO TABS
20.0000 mg | ORAL_TABLET | Freq: Every day | ORAL | 1 refills | Status: DC
  Filled 2021-02-10: qty 90, 90d supply, fill #0
  Filled 2021-05-16: qty 90, 90d supply, fill #1

## 2021-02-17 ENCOUNTER — Other Ambulatory Visit: Payer: Self-pay

## 2021-02-17 ENCOUNTER — Encounter: Payer: 59 | Attending: General Surgery | Admitting: Skilled Nursing Facility1

## 2021-02-17 DIAGNOSIS — E669 Obesity, unspecified: Secondary | ICD-10-CM | POA: Diagnosis not present

## 2021-02-18 NOTE — Progress Notes (Signed)
Pre-Operative Nutrition Class:    Patient was seen on 02/17/2021 for Pre-Operative Bariatric Surgery Education [facility]    Surgery date: 03/04/2021 Surgery type: sleeve Start weight at [facility]: 259.9 Weight today: 261  Samples given per MNT protocol. Patient educated on appropriate usage: Bariatric Advantage Multivitamin Lot # [id number] Exp: 08/23   Procare Calcium  Lot # [id number] Exp: 03/23   Bariatric Advantage protein powder Lot # [id number] Exp: 10/23    The following the learning objectives were met by the patient during this course: Identify Pre-Op Dietary Goals and will begin 2 weeks pre-operatively Identify appropriate sources of fluids and proteins  State protein recommendations and appropriate sources pre and post-operatively Identify Post-Operative Dietary Goals and will follow for 2 weeks post-operatively Identify appropriate multivitamin and calcium sources Describe the need for physical activity post-operatively and will follow MD recommendations State when to call healthcare provider regarding medication questions or post-operative complications When having a diagnosis of diabetes understanding hypoglycemia symptoms and the inclusion of 1 complex carbohydrate per meal  Handouts given during class include: Pre-Op Bariatric Surgery Diet Handout Protein Shake Handout Post-Op Bariatric Surgery Nutrition Handout BELT Program Information Flyer Support Group Information Flyer [facility] Bariatric Supplements Price List  Follow-Up Plan: Patient will follow-up at [facility] 2 weeks post operatively for diet advancement per MD.

## 2021-02-26 ENCOUNTER — Other Ambulatory Visit (HOSPITAL_COMMUNITY): Payer: Self-pay

## 2021-02-26 DIAGNOSIS — I10 Essential (primary) hypertension: Secondary | ICD-10-CM | POA: Diagnosis not present

## 2021-02-27 NOTE — Progress Notes (Addendum)
COVID swab appointment: 02/28/21  COVID Vaccine Completed: yes x3 Date COVID Vaccine completed: 07/08/19, 07/26/19  Has received booster:04/04/20 COVID vaccine manufacturer: [employer]      Date of COVID positive in last 90 days: positive June 8th, home test. Sent to health at work  PCP - Zara Chess, NP Cardiologist - N/a  Chest x-ray - 12/25/20 Epic EKG - 12/25/20 Epic Stress Test - N/a ECHO - 10/15/15 Epic Cardiac Cath - N/a Pacemaker/ICD device last checked: n/a Spinal Cord Stimulator: N/a  Sleep Study - N/a CPAP -   Fasting Blood Sugar - N/a Checks Blood Sugar _____ times a day  Blood Thinner Instructions: N/a Aspirin Instructions: Last Dose:  Activity level: Can go up a flight of stairs and perform activities of daily living without stopping and without symptoms of chest pain or shortness of breath.     Anesthesia review:   Patient denies shortness of breath, fever, cough and chest pain at PAT appointment   Patient verbalized understanding of instructions that were given to them at the PAT appointment. Patient was also instructed that they will need to review over the PAT instructions again at home before surgery.

## 2021-02-27 NOTE — Patient Instructions (Addendum)
DUE TO COVID-19 ONLY ONE VISITOR IS ALLOWED TO COME WITH YOU AND STAY IN THE WAITING ROOM ONLY DURING PRE OP AND PROCEDURE.   **NO VISITORS ARE ALLOWED IN THE SHORT STAY AREA OR RECOVERY ROOM!!**  IF YOU WILL BE ADMITTED INTO THE HOSPITAL YOU ARE ALLOWED ONLY TWO SUPPORT PEOPLE DURING VISITATION HOURS ONLY (10AM -8PM)   The support person(s) may change daily. The support person(s) must pass our screening, gel in and out, and wear a mask at all times, including in the patient's room. Patients must also wear a mask when staff or their support person are in the room.  No visitors under the age of 14. Any visitor under the age of 48 must be accompanied by an adult.    COVID SWAB TESTING MUST BE COMPLETED ON:  02/28/21 **MUST PRESENT COMPLETED FORM AT TESTING SITE**    [facility] [place] (backside of the building) Open 8am-3pm. No appointment needed. You are not required to quarantine, however you are required to wear a well-fitted mask when you are out and around people not in your household.  Hand Hygiene often Do NOT share personal items Notify your provider if you are in close contact with someone who has COVID or you develop fever 100.4 or greater, new onset of sneezing, cough, sore throat, shortness of breath or body aches.       Your procedure is scheduled on: 03/04/21   Report to [facility] Main  Entrance    Report to admitting at 7:15 AM   Call this number if you have problems the morning of surgery [phone]   Do not eat food :After 6pm day before surgery   May have liquids until 6:30 AM day of surgery  CLEAR LIQUID DIET  Foods Allowed                                                                     Foods Excluded  Water, Black Coffee and tea (No milk or creamer)          liquids that you cannot  Plain Jell-O in any flavor  (No red)                                    see through such as: Fruit ices (not with fruit pulp)                                             milk, soups, orange juice              Iced Popsicles (No red)                                               All solid food  Apple juices Sports drinks like Gatorade (No red) Lightly seasoned clear broth or consume(fat free) Sugar        The day of surgery:  Drink ONE (1) Pre-Surgery G2 by 6:30 am the morning of surgery. Drink in one sitting. Do not sip.  This drink was given to you during your hospital  pre-op appointment visit. Nothing else to drink after completing the  Pre-Surgery G2.          If you have questions, please contact your surgeon's office.     Oral Hygiene is also important to reduce your risk of infection.                                    Remember - BRUSH YOUR TEETH THE MORNING OF SURGERY WITH YOUR REGULAR TOOTHPASTE   Take these medicines the morning of surgery with A SIP OF WATER: Buspar, Lexapro                              You may not have any metal on your body including hair pins, jewelry, and body piercing             Do not wear make-up, lotions, powders, perfumes, or deodorant  Do not wear nail polish including gel and S&S, artificial/acrylic nails, or any other type of covering on natural nails including finger and toenails. If you have artificial nails, gel coating, etc. that needs to be removed by a nail salon please have this removed prior to surgery or surgery may need to be canceled/ delayed if the surgeon/ anesthesia feels like they are unable to be safely monitored.   Do not shave  48 hours prior to surgery.    Do not bring valuables to the hospital. [place].   Contacts, dentures or bridgework may not be worn into surgery.   Bring small overnight bag day of surgery.   Please read over the following fact sheets you were given: IF YOU HAVE QUESTIONS ABOUT YOUR PRE OP INSTRUCTIONS PLEASE CALL [phone]- Home - Preparing for  Surgery Before surgery, you can play an important role.  Because skin is not sterile, your skin needs to be as free of germs as possible.  You can reduce the number of germs on your skin by washing with CHG (chlorahexidine gluconate) soap before surgery.  CHG is an antiseptic cleaner which kills germs and bonds with the skin to continue killing germs even after washing. Please DO NOT use if you have an allergy to CHG or antibacterial soaps.  If your skin becomes reddened/irritated stop using the CHG and inform your nurse when you arrive at Short Stay. Do not shave (including legs and underarms) for at least 48 hours prior to the first CHG shower.  You may shave your face/neck.  Please follow these instructions carefully:  1.  Shower with CHG Soap the night before surgery and the  morning of surgery.  2.  If you choose to wash your hair, wash your hair first as usual with your normal  shampoo.  3.  After you shampoo, rinse your hair and body thoroughly to remove the shampoo.  4.  Use CHG as you would any other liquid soap.  You can apply chg directly to the skin and wash.  Gently with a scrungie or clean washcloth.  5.  Apply the CHG Soap to your body ONLY FROM THE NECK DOWN.   Do   not use on face/ open                           Wound or open sores. Avoid contact with eyes, ears mouth and   genitals (private parts).                       Wash face,  Genitals (private parts) with your normal soap.             6.  Wash thoroughly, paying special attention to the area where your    surgery  will be performed.  7.  Thoroughly rinse your body with warm water from the neck down.  8.  DO NOT shower/wash with your normal soap after using and rinsing off the CHG Soap.                9.  Pat yourself dry with a clean towel.            10.  Wear clean pajamas.            11.  Place clean sheets on your bed the night of your first shower and do not  sleep with pets. Day of Surgery  : Do not apply any lotions/deodorants the morning of surgery.  Please wear clean clothes to the hospital/surgery center.  FAILURE TO FOLLOW THESE INSTRUCTIONS MAY RESULT IN THE CANCELLATION OF YOUR SURGERY  PATIENT SIGNATURE_________________________________  NURSE SIGNATURE__________________________________  ________________________________________________________________________    WHAT IS A BLOOD TRANSFUSION? Blood Transfusion Information  A transfusion is the replacement of blood or some of its parts. Blood is made up of multiple cells which provide different functions. Red blood cells carry oxygen and are used for blood loss replacement. White blood cells fight against infection. Platelets control bleeding. Plasma helps clot blood. Other blood products are available for specialized needs, such as hemophilia or other clotting disorders. BEFORE THE TRANSFUSION  Who gives blood for transfusions?  Healthy volunteers who are fully evaluated to make sure their blood is safe. This is blood bank blood. Transfusion therapy is the safest it has ever been in the practice of medicine. Before blood is taken from a donor, a complete history is taken to make sure that person has no history of diseases nor engages in risky social behavior (examples are intravenous drug use or sexual activity with multiple partners). The donor's travel history is screened to minimize risk of transmitting infections, such as malaria. The donated blood is tested for signs of infectious diseases, such as HIV and hepatitis. The blood is then tested to be sure it is compatible with you in order to minimize the chance of a transfusion reaction. If you or a relative donates blood, this is often done in anticipation of surgery and is not appropriate for emergency situations. It takes many days to process the donated blood. RISKS AND COMPLICATIONS Although transfusion therapy is very safe and saves many lives, the main dangers of  transfusion include:  Getting an infectious disease. Developing a transfusion reaction. This is an allergic reaction to something in the blood you were given. Every precaution is taken to prevent this. The  decision to have a blood transfusion has been considered carefully by your caregiver before blood is given. Blood is not given unless the benefits outweigh the risks. AFTER THE TRANSFUSION Right after receiving a blood transfusion, you will usually feel much better and more energetic. This is especially true if your red blood cells have gotten low (anemic). The transfusion raises the level of the red blood cells which carry oxygen, and this usually causes an energy increase. The nurse administering the transfusion will monitor you carefully for complications. HOME CARE INSTRUCTIONS  No special instructions are needed after a transfusion. You may find your energy is better. Speak with your caregiver about any limitations on activity for underlying diseases you may have. SEEK MEDICAL CARE IF:  Your condition is not improving after your transfusion. You develop redness or irritation at the intravenous (IV) site. SEEK IMMEDIATE MEDICAL CARE IF:  Any of the following symptoms occur over the next 12 hours: Shaking chills. You have a temperature by mouth above 102 F (38.9 C), not controlled by medicine. Chest, back, or muscle pain. People around you feel you are not acting correctly or are confused. Shortness of breath or difficulty breathing. Dizziness and fainting. You get a rash or develop hives. You have a decrease in urine output. Your urine turns a dark color or changes to pink, red, or brown. Any of the following symptoms occur over the next 10 days: You have a temperature by mouth above 102 F (38.9 C), not controlled by medicine. Shortness of breath. Weakness after normal activity. The white part of the eye turns yellow (jaundice). You have a decrease in the amount of urine or are  urinating less often. Your urine turns a dark color or changes to pink, red, or brown. Document Released: 06/12/2000 Document Revised: 09/07/2011 Document Reviewed: 01/30/2008 [facility] Patient Information 2014 [place], [place].  _______________________________________________________________________

## 2021-02-28 ENCOUNTER — Encounter (HOSPITAL_COMMUNITY): Payer: Self-pay

## 2021-02-28 ENCOUNTER — Other Ambulatory Visit: Payer: Self-pay

## 2021-02-28 ENCOUNTER — Other Ambulatory Visit: Payer: Self-pay | Admitting: General Surgery

## 2021-02-28 ENCOUNTER — Encounter (HOSPITAL_COMMUNITY)
Admission: RE | Admit: 2021-02-28 | Discharge: 2021-02-28 | Disposition: A | Discharge status: Home / Self Care | Payer: 59 | Source: Ambulatory Visit | Attending: General Surgery | Admitting: General Surgery

## 2021-02-28 DIAGNOSIS — Z01812 Encounter for preprocedural laboratory examination: Secondary | ICD-10-CM | POA: Insufficient documentation

## 2021-02-28 LAB — CBC WITH DIFFERENTIAL/PLATELET
Abs Immature Granulocytes: 0.03 10*3/uL (ref 0.00–0.07)
Basophils Absolute: 0 10*3/uL (ref 0.0–0.1)
Basophils Relative: 0 %
Eosinophils Absolute: 0.2 10*3/uL (ref 0.0–0.5)
Eosinophils Relative: 2 %
HCT: 42.3 % (ref 36.0–46.0)
Hemoglobin: 14.2 g/dL (ref 12.0–15.0)
Immature Granulocytes: 0 %
Lymphocytes Relative: 27 %
Lymphs Abs: 2.7 10*3/uL (ref 0.7–4.0)
MCH: 30.3 pg (ref 26.0–34.0)
MCHC: 33.6 g/dL (ref 30.0–36.0)
MCV: 90.2 fL (ref 80.0–100.0)
Monocytes Absolute: 0.6 10*3/uL (ref 0.1–1.0)
Monocytes Relative: 6 %
Neutro Abs: 6.4 10*3/uL (ref 1.7–7.7)
Neutrophils Relative %: 65 %
Platelets: 224 10*3/uL (ref 150–400)
RBC: 4.69 MIL/uL (ref 3.87–5.11)
RDW: 13.6 % (ref 11.5–15.5)
WBC: 10 10*3/uL (ref 4.0–10.5)
nRBC: 0 % (ref 0.0–0.2)

## 2021-02-28 LAB — COMPREHENSIVE METABOLIC PANEL
ALT: 27 U/L (ref 0–44)
AST: 24 U/L (ref 15–41)
Albumin: 4 g/dL (ref 3.5–5.0)
Alkaline Phosphatase: 53 U/L (ref 38–126)
Anion gap: 8 (ref 5–15)
BUN: 25 mg/dL — ABNORMAL HIGH (ref 6–20)
CO2: 25 mmol/L (ref 22–32)
Calcium: 9.8 mg/dL (ref 8.9–10.3)
Chloride: 107 mmol/L (ref 98–111)
Creatinine, Ser: 0.72 mg/dL (ref 0.44–1.00)
GFR, Estimated: >60 mL/min (ref >60)
Glucose, Bld: 89 mg/dL (ref 70–99)
Potassium: 4.4 mmol/L (ref 3.5–5.1)
Sodium: 140 mmol/L (ref 135–145)
Total Bilirubin: 0.5 mg/dL (ref 0.3–1.2)
Total Protein: 7.6 g/dL (ref 6.5–8.1)

## 2021-02-28 LAB — SARS CORONAVIRUS 2 (TAT 6-24 HRS): SARS Coronavirus 2: NEGATIVE

## 2021-02-28 NOTE — Progress Notes (Signed)
Called patient regarding positive COVID test 12/04/20. She is going to contact [facility] and have them fax proof that she sent them her home COVID test results. Provided patient with [facility] phone number, email and our fax number.

## 2021-03-04 ENCOUNTER — Inpatient Hospital Stay (HOSPITAL_COMMUNITY): Payer: 59 | Admitting: Physician Assistant

## 2021-03-04 ENCOUNTER — Other Ambulatory Visit: Payer: Self-pay

## 2021-03-04 ENCOUNTER — Encounter (HOSPITAL_COMMUNITY)
Admission: RE | Disposition: A | Discharge status: Home / Self Care | Payer: Self-pay | Source: Home / Self Care | Attending: General Surgery

## 2021-03-04 ENCOUNTER — Encounter (HOSPITAL_COMMUNITY): Payer: Self-pay | Admitting: General Surgery

## 2021-03-04 ENCOUNTER — Inpatient Hospital Stay (HOSPITAL_COMMUNITY): Payer: 59 | Admitting: Anesthesiology

## 2021-03-04 ENCOUNTER — Inpatient Hospital Stay (HOSPITAL_COMMUNITY)
Admission: RE | Admit: 2021-03-04 | Discharge: 2021-03-05 | DRG: 621 | Disposition: A | Discharge status: Home / Self Care | Payer: 59 | Attending: General Surgery | Admitting: General Surgery

## 2021-03-04 DIAGNOSIS — Z6839 Body mass index (BMI) 39.0-39.9, adult: Secondary | ICD-10-CM | POA: Diagnosis not present

## 2021-03-04 DIAGNOSIS — K219 Gastro-esophageal reflux disease without esophagitis: Secondary | ICD-10-CM | POA: Diagnosis present

## 2021-03-04 DIAGNOSIS — Z808 Family history of malignant neoplasm of other organs or systems: Secondary | ICD-10-CM

## 2021-03-04 DIAGNOSIS — I10 Essential (primary) hypertension: Secondary | ICD-10-CM | POA: Diagnosis present

## 2021-03-04 DIAGNOSIS — Z8249 Family history of ischemic heart disease and other diseases of the circulatory system: Secondary | ICD-10-CM

## 2021-03-04 DIAGNOSIS — Z87891 Personal history of nicotine dependence: Secondary | ICD-10-CM | POA: Diagnosis not present

## 2021-03-04 DIAGNOSIS — Z20822 Contact with and (suspected) exposure to covid-19: Secondary | ICD-10-CM | POA: Diagnosis present

## 2021-03-04 DIAGNOSIS — E669 Obesity, unspecified: Secondary | ICD-10-CM

## 2021-03-04 HISTORY — PX: LAPAROSCOPIC GASTRIC SLEEVE RESECTION: SHX5895

## 2021-03-04 HISTORY — PX: UPPER GI ENDOSCOPY: SHX6162

## 2021-03-04 LAB — HEMOGLOBIN AND HEMATOCRIT, BLOOD
HCT: 38.2 % (ref 36.0–46.0)
Hemoglobin: 13 g/dL (ref 12.0–15.0)

## 2021-03-04 LAB — TYPE AND SCREEN
ABO/RH(D): A POS
Antibody Screen: NEGATIVE

## 2021-03-04 LAB — PREGNANCY, URINE: Preg Test, Ur: NEGATIVE

## 2021-03-04 SURGERY — GASTRECTOMY, SLEEVE, LAPAROSCOPIC
Anesthesia: General

## 2021-03-04 MED ORDER — OXYCODONE HCL 5 MG PO TABS: 5.0000 mg | ORAL_TABLET | Freq: Once | ORAL | Status: DC | PRN

## 2021-03-04 MED ORDER — ROCURONIUM BROMIDE 10 MG/ML (PF) SYRINGE
PREFILLED_SYRINGE | INTRAVENOUS | Status: AC
  Filled 2021-03-04: qty 20

## 2021-03-04 MED ORDER — 0.9 % SODIUM CHLORIDE (POUR BTL) OPTIME
TOPICAL | Status: DC | PRN
  Administered 2021-03-04: 1000 mL

## 2021-03-04 MED ORDER — BUPIVACAINE HCL 0.25 % IJ SOLN
INTRAMUSCULAR | Status: AC
  Filled 2021-03-04: qty 1

## 2021-03-04 MED ORDER — SODIUM CHLORIDE 0.9 % IV SOLN
2.0000 g | INTRAVENOUS | Status: AC
  Administered 2021-03-04: 2 g via INTRAVENOUS
  Filled 2021-03-04: qty 2

## 2021-03-04 MED ORDER — MEPERIDINE HCL 50 MG/ML IJ SOLN: 6.2500 mg | INTRAMUSCULAR | Status: DC | PRN

## 2021-03-04 MED ORDER — FENTANYL CITRATE PF 50 MCG/ML IJ SOSY
25.0000 ug | PREFILLED_SYRINGE | INTRAMUSCULAR | Status: DC | PRN
  Administered 2021-03-04: 50 ug via INTRAVENOUS

## 2021-03-04 MED ORDER — LACTATED RINGERS IR SOLN
Status: DC | PRN
  Administered 2021-03-04: 1

## 2021-03-04 MED ORDER — ONDANSETRON HCL 4 MG/2ML IJ SOLN
4.0000 mg | INTRAMUSCULAR | Status: DC | PRN
  Administered 2021-03-04: 4 mg via INTRAVENOUS

## 2021-03-04 MED ORDER — ROCURONIUM BROMIDE 10 MG/ML (PF) SYRINGE
PREFILLED_SYRINGE | INTRAVENOUS | Status: DC | PRN
  Administered 2021-03-04: 70 mg via INTRAVENOUS
  Administered 2021-03-04: 10 mg via INTRAVENOUS

## 2021-03-04 MED ORDER — DEXTROSE-NACL 5-0.45 % IV SOLN: INTRAVENOUS | Status: DC

## 2021-03-04 MED ORDER — EPHEDRINE SULFATE-NACL 50-0.9 MG/10ML-% IV SOSY
PREFILLED_SYRINGE | INTRAVENOUS | Status: DC | PRN
  Administered 2021-03-04 (×4): 10 mg via INTRAVENOUS

## 2021-03-04 MED ORDER — APREPITANT 40 MG PO CAPS: 40.0000 mg | ORAL_CAPSULE | Freq: Once | ORAL | Status: DC

## 2021-03-04 MED ORDER — BUPIVACAINE LIPOSOME 1.3 % IJ SUSP
INTRAMUSCULAR | Status: AC
  Filled 2021-03-04: qty 10

## 2021-03-04 MED ORDER — HEPARIN SODIUM (PORCINE) 5000 UNIT/ML IJ SOLN
5000.0000 [IU] | INTRAMUSCULAR | Status: AC
  Administered 2021-03-04: 5000 [IU] via SUBCUTANEOUS
  Filled 2021-03-04: qty 1

## 2021-03-04 MED ORDER — SUGAMMADEX SODIUM 500 MG/5ML IV SOLN
INTRAVENOUS | Status: AC
  Filled 2021-03-04: qty 5

## 2021-03-04 MED ORDER — ACETAMINOPHEN 160 MG/5ML PO SOLN: 325.0000 mg | ORAL | Status: DC | PRN

## 2021-03-04 MED ORDER — ONDANSETRON HCL 4 MG/2ML IJ SOLN: 4.0000 mg | Freq: Once | INTRAMUSCULAR | Status: DC | PRN

## 2021-03-04 MED ORDER — PROPOFOL 10 MG/ML IV BOLUS
INTRAVENOUS | Status: AC
  Filled 2021-03-04: qty 40

## 2021-03-04 MED ORDER — DEXAMETHASONE SODIUM PHOSPHATE 10 MG/ML IJ SOLN
INTRAMUSCULAR | Status: AC
  Filled 2021-03-04: qty 2

## 2021-03-04 MED ORDER — DEXMEDETOMIDINE (PRECEDEX) IN NS 20 MCG/5ML (4 MCG/ML) IV SYRINGE
PREFILLED_SYRINGE | INTRAVENOUS | Status: AC
  Filled 2021-03-04: qty 10

## 2021-03-04 MED ORDER — DEXAMETHASONE SODIUM PHOSPHATE 4 MG/ML IJ SOLN: 4.0000 mg | INTRAMUSCULAR | Status: DC

## 2021-03-04 MED ORDER — DEXMEDETOMIDINE (PRECEDEX) IN NS 20 MCG/5ML (4 MCG/ML) IV SYRINGE
PREFILLED_SYRINGE | INTRAVENOUS | Status: DC | PRN
  Administered 2021-03-04 (×2): 8 ug via INTRAVENOUS

## 2021-03-04 MED ORDER — BUPIVACAINE-EPINEPHRINE (PF) 0.25% -1:200000 IJ SOLN
INTRAMUSCULAR | Status: AC
  Filled 2021-03-04: qty 30

## 2021-03-04 MED ORDER — ONDANSETRON HCL 4 MG/2ML IJ SOLN
INTRAMUSCULAR | Status: DC | PRN
  Administered 2021-03-04: 4 mg via INTRAVENOUS

## 2021-03-04 MED ORDER — OXYCODONE HCL 5 MG/5ML PO SOLN: 5.0000 mg | Freq: Once | ORAL | Status: DC | PRN

## 2021-03-04 MED ORDER — EPHEDRINE 5 MG/ML INJ
INTRAVENOUS | Status: AC
  Filled 2021-03-04: qty 5

## 2021-03-04 MED ORDER — OXYCODONE HCL 5 MG/5ML PO SOLN
5.0000 mg | Freq: Four times a day (QID) | ORAL | Status: DC | PRN
  Administered 2021-03-05: 5 mg via ORAL
  Filled 2021-03-04: qty 5

## 2021-03-04 MED ORDER — ONDANSETRON HCL 4 MG/2ML IJ SOLN
INTRAMUSCULAR | Status: AC
  Filled 2021-03-04: qty 4

## 2021-03-04 MED ORDER — FENTANYL CITRATE (PF) 100 MCG/2ML IJ SOLN
INTRAMUSCULAR | Status: DC | PRN
  Administered 2021-03-04: 100 ug via INTRAVENOUS

## 2021-03-04 MED ORDER — LACTATED RINGERS IV SOLN
INTRAVENOUS | Status: DC
Start: 2021-03-04 — End: 2021-03-04

## 2021-03-04 MED ORDER — KETAMINE HCL 10 MG/ML IJ SOLN
INTRAMUSCULAR | Status: DC | PRN
  Administered 2021-03-04: 30 mg via INTRAVENOUS

## 2021-03-04 MED ORDER — BUPIVACAINE LIPOSOME 1.3 % IJ SUSP: 20.0000 mL | Freq: Once | INTRAMUSCULAR | Status: DC

## 2021-03-04 MED ORDER — SUGAMMADEX SODIUM 500 MG/5ML IV SOLN
INTRAVENOUS | Status: DC | PRN
  Administered 2021-03-04: 300 mg via INTRAVENOUS

## 2021-03-04 MED ORDER — BUPIVACAINE LIPOSOME 1.3 % IJ SUSP
INTRAMUSCULAR | Status: DC | PRN
  Administered 2021-03-04: 20 mL

## 2021-03-04 MED ORDER — CHLORHEXIDINE GLUCONATE CLOTH 2 % EX PADS: 6.0000 | MEDICATED_PAD | Freq: Once | CUTANEOUS | Status: DC

## 2021-03-04 MED ORDER — APREPITANT 40 MG PO CAPS
40.0000 mg | ORAL_CAPSULE | ORAL | Status: AC
  Administered 2021-03-04: 40 mg via ORAL
  Filled 2021-03-04: qty 1

## 2021-03-04 MED ORDER — MIDAZOLAM HCL 5 MG/5ML IJ SOLN
INTRAMUSCULAR | Status: DC | PRN
  Administered 2021-03-04: 2 mg via INTRAVENOUS

## 2021-03-04 MED ORDER — FAMOTIDINE IN NACL 20-0.9 MG/50ML-% IV SOLN
20.0000 mg | Freq: Two times a day (BID) | INTRAVENOUS | Status: DC
  Administered 2021-03-04 – 2021-03-05 (×3): 20 mg via INTRAVENOUS
  Filled 2021-03-04 (×3): qty 50

## 2021-03-04 MED ORDER — DEXAMETHASONE SODIUM PHOSPHATE 10 MG/ML IJ SOLN
INTRAMUSCULAR | Status: DC | PRN
  Administered 2021-03-04: 10 mg via INTRAVENOUS

## 2021-03-04 MED ORDER — MIDAZOLAM HCL 2 MG/2ML IJ SOLN
INTRAMUSCULAR | Status: AC
  Filled 2021-03-04: qty 2

## 2021-03-04 MED ORDER — LIDOCAINE 2% (20 MG/ML) 5 ML SYRINGE
INTRAMUSCULAR | Status: AC
  Filled 2021-03-04: qty 10

## 2021-03-04 MED ORDER — KETAMINE HCL 10 MG/ML IJ SOLN
INTRAMUSCULAR | Status: AC
  Filled 2021-03-04: qty 1

## 2021-03-04 MED ORDER — ACETAMINOPHEN 500 MG PO TABS
1000.0000 mg | ORAL_TABLET | Freq: Three times a day (TID) | ORAL | Status: DC
  Administered 2021-03-04 – 2021-03-05 (×3): 1000 mg via ORAL
  Filled 2021-03-04 (×2): qty 2

## 2021-03-04 MED ORDER — ACETAMINOPHEN 160 MG/5ML PO SOLN: 1000.0000 mg | Freq: Three times a day (TID) | ORAL | Status: DC

## 2021-03-04 MED ORDER — BUPIVACAINE HCL 0.25 % IJ SOLN
INTRAMUSCULAR | Status: DC | PRN
  Administered 2021-03-04: 30 mL

## 2021-03-04 MED ORDER — MORPHINE SULFATE (PF) 2 MG/ML IV SOLN: 1.0000 mg | INTRAVENOUS | Status: DC | PRN

## 2021-03-04 MED ORDER — ENOXAPARIN SODIUM 30 MG/0.3ML IJ SOSY
30.0000 mg | PREFILLED_SYRINGE | Freq: Two times a day (BID) | INTRAMUSCULAR | Status: DC
  Administered 2021-03-04 – 2021-03-05 (×2): 30 mg via SUBCUTANEOUS
  Filled 2021-03-04 (×2): qty 0.3

## 2021-03-04 MED ORDER — ACETAMINOPHEN 500 MG PO TABS
1000.0000 mg | ORAL_TABLET | ORAL | Status: AC
  Administered 2021-03-04: 1000 mg via ORAL
  Filled 2021-03-04: qty 2

## 2021-03-04 MED ORDER — HYDRALAZINE HCL 10 MG PO TABS
10.0000 mg | ORAL_TABLET | Freq: Three times a day (TID) | ORAL | Status: DC | PRN
  Administered 2021-03-04: 10 mg via ORAL
  Filled 2021-03-04 (×3): qty 1

## 2021-03-04 MED ORDER — FENTANYL CITRATE PF 50 MCG/ML IJ SOSY
PREFILLED_SYRINGE | INTRAMUSCULAR | Status: AC
  Filled 2021-03-04: qty 3

## 2021-03-04 MED ORDER — ORAL CARE MOUTH RINSE: 15.0000 mL | Freq: Once | OROMUCOSAL | Status: AC

## 2021-03-04 MED ORDER — SIMETHICONE 80 MG PO CHEW
80.0000 mg | CHEWABLE_TABLET | Freq: Four times a day (QID) | ORAL | Status: DC | PRN
  Administered 2021-03-04 – 2021-03-05 (×4): 80 mg via ORAL
  Filled 2021-03-04 (×5): qty 1

## 2021-03-04 MED ORDER — GABAPENTIN 300 MG PO CAPS
300.0000 mg | ORAL_CAPSULE | ORAL | Status: AC
  Administered 2021-03-04: 300 mg via ORAL
  Filled 2021-03-04: qty 1

## 2021-03-04 MED ORDER — LIDOCAINE 2% (20 MG/ML) 5 ML SYRINGE
INTRAMUSCULAR | Status: DC | PRN
  Administered 2021-03-04: 80 mg via INTRAVENOUS

## 2021-03-04 MED ORDER — ENSURE MAX PROTEIN PO LIQD
2.0000 [oz_av] | ORAL | Status: DC
  Administered 2021-03-05 (×5): 2 [oz_av] via ORAL

## 2021-03-04 MED ORDER — CHLORHEXIDINE GLUCONATE 0.12 % MT SOLN
15.0000 mL | Freq: Once | OROMUCOSAL | Status: AC
  Administered 2021-03-04: 15 mL via OROMUCOSAL

## 2021-03-04 MED ORDER — ONDANSETRON HCL 4 MG/2ML IJ SOLN
INTRAMUSCULAR | Status: AC
  Filled 2021-03-04: qty 2

## 2021-03-04 MED ORDER — PROPOFOL 10 MG/ML IV BOLUS
INTRAVENOUS | Status: DC | PRN
  Administered 2021-03-04: 200 mg via INTRAVENOUS

## 2021-03-04 MED ORDER — FENTANYL CITRATE (PF) 100 MCG/2ML IJ SOLN
INTRAMUSCULAR | Status: AC
  Filled 2021-03-04: qty 2

## 2021-03-04 MED ORDER — ACETAMINOPHEN 325 MG PO TABS: 325.0000 mg | ORAL_TABLET | ORAL | Status: DC | PRN

## 2021-03-04 MED ORDER — SCOPOLAMINE 1 MG/3DAYS TD PT72
1.0000 | MEDICATED_PATCH | TRANSDERMAL | Status: DC
  Administered 2021-03-04: 1.5 mg via TRANSDERMAL
  Filled 2021-03-04: qty 1

## 2021-03-04 SURGICAL SUPPLY — 62 items
APL PRP STRL LF DISP 70% ISPRP (MISCELLANEOUS) ×1
APL SKNCLS STERI-STRIP NONHPOA (GAUZE/BANDAGES/DRESSINGS) ×1
APPLIER CLIP ROT 13.4 12 LRG (CLIP) ×2
APR CLP LRG 13.4X12 ROT 20 MLT (CLIP) ×1
BAG COUNTER SPONGE SURGICOUNT (BAG) IMPLANT
BAG LAPAROSCOPIC 12 15 PORT 16 (BASKET) ×1 IMPLANT
BAG RETRIEVAL 12/15 (BASKET) ×2
BAG SPNG CNTER NS LX DISP (BAG)
BENZOIN TINCTURE PRP APPL 2/3 (GAUZE/BANDAGES/DRESSINGS) ×2 IMPLANT
BLADE SURG SZ11 CARB STEEL (BLADE) ×2 IMPLANT
BNDG ADH 1X3 SHEER STRL LF (GAUZE/BANDAGES/DRESSINGS) ×12 IMPLANT
BNDG ADH THN 3X1 STRL LF (GAUZE/BANDAGES/DRESSINGS) ×6
CABLE HIGH FREQUENCY MONO STRZ (ELECTRODE) IMPLANT
CHLORAPREP W/TINT 26 (MISCELLANEOUS) ×2 IMPLANT
CLIP APPLIE ROT 13.4 12 LRG (CLIP) IMPLANT
COVER SURGICAL LIGHT HANDLE (MISCELLANEOUS) ×2 IMPLANT
DECANTER SPIKE VIAL GLASS SM (MISCELLANEOUS) ×2 IMPLANT
DRAPE UTILITY XL STRL (DRAPES) ×4 IMPLANT
ELECT REM PT RETURN 15FT ADLT (MISCELLANEOUS) ×2 IMPLANT
GAUZE 4X4 16PLY ~~LOC~~+RFID DBL (SPONGE) ×2 IMPLANT
GLOVE SURG POLYISO LF SZ7 (GLOVE) ×2 IMPLANT
GLOVE SURG UNDER POLY LF SZ7 (GLOVE) ×2 IMPLANT
GOWN STRL REUS W/TWL LRG LVL3 (GOWN DISPOSABLE) ×2 IMPLANT
GOWN STRL REUS W/TWL XL LVL3 (GOWN DISPOSABLE) ×6 IMPLANT
GRASPER SUT TROCAR 14GX15 (MISCELLANEOUS) ×2 IMPLANT
KIT BASIN OR (CUSTOM PROCEDURE TRAY) ×2 IMPLANT
KIT TURNOVER KIT A (KITS) ×2 IMPLANT
MARKER SKIN DUAL TIP RULER LAB (MISCELLANEOUS) ×2 IMPLANT
MAT PREVALON FULL STRYKER (MISCELLANEOUS) IMPLANT
NDL SPNL 22GX3.5 QUINCKE BK (NEEDLE) ×1 IMPLANT
NEEDLE SPNL 22GX3.5 QUINCKE BK (NEEDLE) ×2 IMPLANT
PACK UNIVERSAL I (CUSTOM PROCEDURE TRAY) ×2 IMPLANT
RELOAD STAPLE 60 3.6 BLU REG (STAPLE) IMPLANT
RELOAD STAPLE 60 3.8 GOLD REG (STAPLE) IMPLANT
RELOAD STAPLE 60 4.1 GRN THCK (STAPLE) IMPLANT
RELOAD STAPLER BLUE 60MM (STAPLE) ×4 IMPLANT
RELOAD STAPLER GOLD 60MM (STAPLE) ×1 IMPLANT
RELOAD STAPLER GREEN 60MM (STAPLE) ×1 IMPLANT
SCISSORS LAP 5X45 EPIX DISP (ENDOMECHANICALS) IMPLANT
SET IRRIG TUBING LAPAROSCOPIC (IRRIGATION / IRRIGATOR) ×2 IMPLANT
SET TUBE SMOKE EVAC HIGH FLOW (TUBING) ×2 IMPLANT
SHEARS HARMONIC ACE PLUS 45CM (MISCELLANEOUS) ×2 IMPLANT
SLEEVE GASTRECTOMY 40FR VISIGI (MISCELLANEOUS) ×2 IMPLANT
SLEEVE XCEL OPT CAN 5 100 (ENDOMECHANICALS) ×5 IMPLANT
SOL ANTI FOG 6CC (MISCELLANEOUS) ×1 IMPLANT
SOLUTION ANTI FOG 6CC (MISCELLANEOUS) ×1
STAPLER ECHELON LONG 60 440 (INSTRUMENTS) ×2 IMPLANT
STAPLER RELOAD BLUE 60MM (STAPLE) ×8
STAPLER RELOAD GOLD 60MM (STAPLE) ×2
STAPLER RELOAD GREEN 60MM (STAPLE) ×2
STRIP CLOSURE SKIN 1/2X4 (GAUZE/BANDAGES/DRESSINGS) ×2 IMPLANT
SUT ETHIBOND 0 36 GRN (SUTURE) IMPLANT
SUT MNCRL AB 4-0 PS2 18 (SUTURE) ×2 IMPLANT
SUT VICRYL 0 TIES 12 18 (SUTURE) ×2 IMPLANT
SYR 20ML LL LF (SYRINGE) ×2 IMPLANT
SYR 50ML LL SCALE MARK (SYRINGE) ×2 IMPLANT
TAPE STRIPS DRAPE STRL (GAUZE/BANDAGES/DRESSINGS) ×1 IMPLANT
TOWEL OR 17X26 10 PK STRL BLUE (TOWEL DISPOSABLE) ×2 IMPLANT
TOWEL OR NON WOVEN STRL DISP B (DISPOSABLE) ×2 IMPLANT
TROCAR BLADELESS 15MM (ENDOMECHANICALS) ×2 IMPLANT
TROCAR BLADELESS OPT 5 100 (ENDOMECHANICALS) ×2 IMPLANT
TUBING CONNECTING 10 (TUBING) ×4 IMPLANT

## 2021-03-04 NOTE — Progress Notes (Signed)
Patient has ambulated, is voiding, using her incentive spirometer,and her vitals are stable. Patient started drinking her first 2oz cup of water at 1600.

## 2021-03-04 NOTE — H&P (Signed)
PROVIDER: Mickeal Skinner, MD  Subjective   Chief Complaint: Weight Loss   History of Present Illness: Mackenzie Holland is a 40 y.o. female who is seen today for bariatric surgery. She has no new medical problems.  Review of Systems: A complete review of systems was obtained from the patient. I have reviewed this information and discussed as appropriate with the patient. See HPI as well for other ROS.  Review of Systems  Constitutional: Negative.  HENT: Negative.  Eyes: Negative.  Respiratory: Negative.  Cardiovascular: Negative.  Gastrointestinal: Negative.  Genitourinary: Negative.  Musculoskeletal: Negative.  Skin: Negative.  Neurological: Negative.  Endo/Heme/Allergies: Negative.  Psychiatric/Behavioral: Negative.    Medical History: Past Medical History:  Diagnosis Date   GERD (gastroesophageal reflux disease)   Hypertension   There is no problem list on file for this patient.  Past Surgical History:  Procedure Laterality Date   APPENDECTOMY   COMBINED HYSTEROSCOPY DIAGNOSTIC / D&C    No Known Allergies  Current Outpatient Medications on File Prior to Visit  Medication Sig Dispense Refill   busPIRone (BUSPAR) 10 MG tablet   docusate (COLACE) 100 MG capsule Take 100 mg by mouth once daily   escitalopram oxalate (LEXAPRO) 20 MG tablet   losartan (COZAAR) 25 MG tablet   No current facility-administered medications on file prior to visit.   Family History  Problem Relation Age of Onset   Skin cancer Mother   High blood pressure (Hypertension) Father    Social History   Tobacco Use  Smoking Status Former Smoker  Smokeless Tobacco Never Used    Social History   Socioeconomic History   Marital status: Unknown  Tobacco Use   Smoking status: Former Smoker   Smokeless tobacco: Never Used  Substance and Sexual Activity   Alcohol use: Never   Objective:   Vitals:  02/26/21 1017  BP: (!) 160/96  Pulse: 87  Temp: 36.7 C (98.1 F)  SpO2:  99%  Weight: (!) 118.8 kg (261 lb 12.8 oz)  Height: 172.7 cm ('5\' 8"'$ )   Body mass index is 39.81 kg/m.  Physical Exam Constitutional:  Appearance: Normal appearance.  HENT:  Head: Normocephalic and atraumatic.  Pulmonary:  Effort: Pulmonary effort is normal.  Musculoskeletal:  General: Normal range of motion.  Cervical back: Normal range of motion.  Neurological:  General: No focal deficit present.  Mental Status: She is alert and oriented to person, place, and time. Mental status is at baseline.  Psychiatric:  Mood and Affect: Mood normal.  Behavior: Behavior normal.  Thought Content: Thought content normal.     Assessment and Plan:  Diagnoses and all orders for this visit:  Class 2 severe obesity with body mass index (BMI) of 35 to 39.9 with serious comorbidity (CMS-HCC)  Essential hypertension    The patient meets weight loss surgery criteria. Due to the above reasons, I think laparoscopic vertical sleeve gastrectomy is the best option for the patient.   We discussed LSG. We discussed the preoperative, operative and postoperative process. I explained the surgery in detail including the performance of an EGD near the end of the surgery to test for leak. We discussed the typical hospital course including a 1-2 day stay baring any complications. The patient was given educational material. I quoted the patient that most patients can lose up to 50-70% of their excess weight. We did discuss the possibility of weight regain several years after the procedure.   The risks of infection, bleeding, pain, scarring, weight regain, too  little or too much weight loss, vitamin deficiencies and need for lifelong vitamin supplementation, hair loss, need for protein supplementation, leaks, stricture, reflux, food intolerance, gallstone formation, hernia, need for reoperation, need for open surgery, injury to spleen or surrounding structures, DVT's, PE, and death again discussed with the patient  and the patient expressed understanding and desires to proceed with laparoscopic sleeve gastrectomy, possible open, intraoperative endoscopy.  We discussed that before and after surgery that there would be an alteration in their diet. I explained that we may put them on a diet 2 weeks before surgery. I also explained that they would be on a liquid diet for 2 weeks after surgery. We discussed that they would have to avoid certain foods after surgery. We discussed the importance of physical activity as well as compliance with our dietary and supplement recommendations and routine follow-up.  I explained to the patient that we will start our evaluation process which includes labs, Upper GI to evaluate stomach and swallowing anatomy, dietary consultation, psychology consult   No follow-ups on file.  Mickeal Skinner, MD

## 2021-03-04 NOTE — Op Note (Signed)
Preop Diagnosis: Obesity Class II with major comorbidity  Postop Diagnosis: same  Procedure performed: laparoscopic Sleeve Gastrectomy  Assitant: Louanna Raw  Indications:  The patient is a 40 y.o. year-old morbidly obese female who has been followed in the Bariatric Clinic as an outpatient. This patient was diagnosed with morbid obesity with a BMI of Body mass index is 39.26 kg/m. and significant co-morbidities including hypertension and GERD.  The patient was counseled extensively in the Bariatric Outpatient Clinic and after a thorough explanation of the risks and benefits of surgery (including death from complications, bowel leak, infection such as peritonitis and/or sepsis, internal hernia, bleeding, need for blood transfusion, bowel obstruction, organ failure, pulmonary embolus, deep venous thrombosis, wound infection, incisional hernia, skin breakdown, and others entailed on the consent form) and after a compliant diet and exercise program, the patient was scheduled for an elective laparoscopic sleeve gastrectomy.  Description of Operation:  Following informed consent, the patient was taken to the operating room and placed on the operating table in the supine position.  She had previously received prophylactic antibiotics and subcutaneous heparin for DVT prophylaxis in the pre-op holding area.  After induction of general endotracheal anesthesia by the anesthesiologist, the patient underwent placement of sequential compression devices and an oro-gastric tube.  A timeout was confirmed by the surgery and anesthesia teams.  The patient was adequately padded at all pressure points and placed on a footboard to prevent slippage from the OR table during extremes of position during surgery.  She underwent a routine sterile prep and drape of her entire abdomen.    Next, A transverse incision was made under the left subcostal area and a 80m optical viewing trocar was introduced into the peritoneal  cavity. Pneumoperitoneum was applied with a high flow and low pressure. A laparoscope was inserted to confirm placement. A extraperitoneal block was then placed at the lateral abdominal wall using exparel diluted with marcaine. 5 additional incisions were placed: 1 569mtrocar to the left of the midline. 1 additional 36m59mrocar in the left lateral area, 1 73m93mocar in the right mid abdomen, 1 36mm 42mcar in the right subcostal area, and a Nathanson retractor was placed through a subxiphoid incision.  Next, a hole was created through the lesser omentum along the greater curve of the stomach to enter the lesser sac. The vessels along the greater omentum were  Then ligated and divided using the Harmonic scalpel moving towards the spleen and then short gastric vessels were ligated and divided in the same fashion to fully mobilize the fundus. The left crus was identified to ensure completion of the dissection. Next the antrum was measured and dissection continued inferiorly along the greater curve towards the pylorus and stopped 6cm from the pylorus.   A 40Fr ViSiGi dilator was placed into the esophgaus and along the lesser curve of the stomach and placed on suction. 1 non-reinforced 60mm 81mn load echelon stapler(s) followed by 1 60mm G29mload echelon stapler(s) followed by 3 60mm bl46moad echelon stapler(s) were used to make the resection along the antrum being sure to stay well away from the angularis by angling the jaws of the stapler towards the greater curve and later completing the resection staying along the ViSiGi aMiltonuring the fundus was not retained by appropriately retracting it lateral. Air was inserted through the ViSiGi tMokaneorm a leak test showing no bubbles and a neutral lie of the stomach.  The assistant then went and performed an upper endoscopy and  leak test. No bubbles were seen and the sleeve and antrum distended appropriately. The specimen was then placed in an endocatch bag and  removed by the 40m port. 3 clips were put ont the lower staple line for bleeding. The fascia of the 142mport was closed with a 0 vicryl by suture passer. Hemostasis was ensured. Pneumoperitoneum was evacuated, all ports were removed and all incisions closed with 4-0 monocryl suture in subcuticular fashion. Steristrips and bandaids were put in place for dressing. The patient awoke from anesthesia and was brought to pacu in stable condition. All counts were correct.  Estimated blood loss: <3026mSpecimens:  Sleeve gastrectomy  Local Anesthesia: 50 ml Exparel:0.5% Marcaine mix  Post-Op Plan:       Pain Management: PO, prn      Antibiotics: Prophylactic      Anticoagulation: Prophylactic, Starting now      Post Op Studies/Consults: Not applicable      Intended Discharge: within 48h      Intended Outpatient Follow-Up: Two Week      Intended Outpatient Studies: Not Applicable      Other: Not Applicable  LukArta Brucensinger

## 2021-03-04 NOTE — Transfer of Care (Signed)
Immediate Anesthesia Transfer of Care Note  Patient: Mackenzie Holland  Procedure(s) Performed: Procedure(s): LAPAROSCOPIC GASTRIC SLEEVE RESECTION (N/A) UPPER GI ENDOSCOPY (N/A)  Patient Location: PACU  Anesthesia Type:General  Level of Consciousness:  sedated, patient cooperative and responds to stimulation  Airway & Oxygen Therapy:Patient Spontanous Breathing and Patient connected to face mask oxgen  Post-op Assessment:  Report given to PACU RN and Post -op Vital signs reviewed and stable  Post vital signs:  Reviewed and stable  Last Vitals:  Vitals:   03/04/21 0750 03/04/21 1023  BP: (!) 149/98 (!) 155/92  Pulse: 80 80  Resp: 16 15  Temp: 36.8 C   SpO2: 123456 [id number]    Complications: No apparent anesthesia complications

## 2021-03-04 NOTE — Anesthesia Procedure Notes (Signed)
Procedure Name: Intubation Date/Time: 03/04/2021 9:15 AM Performed by: Lavina Hamman, CRNA Pre-anesthesia Checklist: Patient identified, Emergency Drugs available, Suction available, Patient being monitored and Timeout performed Patient Re-evaluated:Patient Re-evaluated prior to induction Oxygen Delivery Method: Circle system utilized Preoxygenation: Pre-oxygenation with 100% oxygen Induction Type: IV induction Ventilation: Mask ventilation without difficulty Laryngoscope Size: Mac and 3 Grade View: Grade II Tube type: Oral Tube size: 7.0 mm Number of attempts: 1 Airway Equipment and Method: Stylet Placement Confirmation: ETT inserted through vocal cords under direct vision, positive ETCO2, CO2 detector and breath sounds checked- equal and bilateral Secured at: 22 cm Tube secured with: Tape Dental Injury: Teeth and Oropharynx as per pre-operative assessment  Comments: ATOI

## 2021-03-04 NOTE — Anesthesia Preprocedure Evaluation (Addendum)
Anesthesia Evaluation  Patient identified by MRN, date of birth, ID band Patient awake    Reviewed: Allergy & Precautions, H&P , NPO status , Patient's Chart, lab work & pertinent test results, reviewed documented beta blocker date and time   History of Anesthesia Complications (+) PONV and history of anesthetic complications  Airway Mallampati: II  TM Distance: >3 FB Neck ROM: full    Dental no notable dental hx. (+) Teeth Intact, Dental Advisory Given, Caps,    Pulmonary neg pulmonary ROS, former smoker,    Pulmonary exam normal breath sounds clear to auscultation       Cardiovascular Exercise Tolerance: Good hypertension, Pt. on medications Normal cardiovascular exam Rhythm:regular Rate:Normal     Neuro/Psych  Headaches, PSYCHIATRIC DISORDERS Anxiety Depression    GI/Hepatic Neg liver ROS, GERD  Controlled,  Endo/Other  negative endocrine ROS  Renal/GU negative Renal ROS  negative genitourinary   Musculoskeletal   Abdominal   Peds  Hematology negative hematology ROS (+)   Anesthesia Other Findings   Reproductive/Obstetrics negative OB ROS                            Anesthesia Physical Anesthesia Plan  ASA: 2  Anesthesia Plan: General   Post-op Pain Management:    Induction: Intravenous  PONV Risk Score and Plan: 3 and Ondansetron, Dexamethasone, Midazolam and Treatment may vary due to age or medical condition  Airway Management Planned: Oral ETT  Additional Equipment: None  Intra-op Plan:   Post-operative Plan: Extubation in OR  Informed Consent: I have reviewed the patients History and Physical, chart, labs and discussed the procedure including the risks, benefits and alternatives for the proposed anesthesia with the patient or authorized representative who has indicated his/her understanding and acceptance.     Dental Advisory Given  Plan Discussed with: CRNA and  Anesthesiologist  Anesthesia Plan Comments:         Anesthesia Quick Evaluation

## 2021-03-04 NOTE — Progress Notes (Signed)

## 2021-03-04 NOTE — Op Note (Signed)
   Patient: Mackenzie Holland (1980-12-23, [id number])  Date of Surgery: 03/04/2021   Preoperative Diagnosis: MORBID OBESITY   Postoperative Diagnosis: MORBID OBESITY   Surgical Procedure: Upper Endoscopy   Surgeon: Louanna Raw, MD  Anesthesiologist: Janeece Riggers, MD CRNA: Lavina Hamman, CRNA   Anesthesia: General   Fluids:  Total I/O In: -  Out: 25 0000000  Complications: None  Drains:  None  Specimen: None   Indications for Procedure: [name] is a 40 y.o. female undergoing sleeve gastrectomy and an EGD was requested to evaluate foregut anatomy intraoperatively.  Description of Procedure: During the procedure, I scrubbed out and obtained the Olympus endoscope. I gently placed endoscope in the patient's oropharynx and gently glided it down the esophagus without any difficulty under direct visualization.  The scope was advanced as far as the pylorus and then slowly withdrawn to inspect the foregut anatomy.  Dr. Kieth Brightly had placed saline in the upper abdomen and all staple lines were submerged to ensure no air leak. There was no evidence of bubbles. There was no evidence of intraluminal bleeding and the mucosa appeared healthy.  The lumen was widely patent without evidence of stricture.  The intraluminal insufflation was decompressed. The scope was withdrawn. The patient tolerated this portion of the procedure well. Please see Dr Amie Portland operative note for details regarding the remainder of the procedure.    Louanna Raw, MD General, Bariatric, & Minimally Invasive Surgery [facility], [place]

## 2021-03-04 NOTE — Progress Notes (Signed)
PHARMACY CONSULT FOR:  Risk Assessment for Post-Discharge VTE Following Bariatric Surgery  Post-Discharge VTE Risk Assessment: This patient's probability of 30-day post-discharge VTE is increased due to the factors marked:   Female    Age >/=60 years    BMI >/=50 kg/m2    CHF    Dyspnea at Rest    Paraplegia  X  Non-gastric-band surgery    Operation Time >/=3 hr    Return to OR     Length of Stay >/= 3 d   Hx of VTE   Hypercoagulable condition   Significant venous stasis       Predicted probability of 30-day post-discharge VTE: 0.16%   Recommendation for Discharge: No pharmacologic prophylaxis post-discharge    Mackenzie Holland is a 40 y.o. female who underwent  laparoscopic Sleeve Gastrectomy on 03/04/21.   Case start: 0921 Case end: 1013   No Known Allergies  Patient Measurements: Height: '5\' 8"'$  (172.7 cm) Weight: 117.1 kg (258 lb 3.2 oz) IBW/kg (Calculated) : 63.9 Body mass index is 39.26 kg/m.  No results for input(s): WBC, HGB, HCT, PLT, APTT, CREATININE, LABCREA, CREATININE, CREAT24HRUR, MG, PHOS, ALBUMIN, PROT, ALBUMIN, AST, ALT, ALKPHOS, BILITOT, BILIDIR, IBILI in the last 72 hours. Estimated Creatinine Clearance: 128.2 mL/min (by C-G formula based on SCr of 0.72 mg/dL).    Past Medical History:  Diagnosis Date   Anxiety    Borderline hypercholesterolemia 12/11/2018   Depression    GERD (gastroesophageal reflux disease)    Hypertension    Migraine    Recurrent sinus infections      Medications Prior to Admission  Medication Sig Dispense Refill Last Dose   busPIRone (BUSPAR) 10 MG tablet Take 1 tablet by mouth 2 times a day as needed 180 tablet 1 03/04/2021 at 0615   diclofenac (VOLTAREN) 75 MG EC tablet Take 1 tablet by mouth 2 times a day with a meal 60 tablet 1 03/03/2021   docusate sodium (COLACE) 100 MG capsule Take 100 mg by mouth daily.   03/03/2021   escitalopram (LEXAPRO) 20 MG tablet Take 1 tablet by mouth daily 90 tablet 1 03/04/2021 at 0615    losartan (COZAAR) 25 MG tablet TAKE ONE TABLET BY MOUTH DAILY. 90 tablet 1 03/03/2021   Vitamin D, Ergocalciferol, (DRISDOL) 1.25 MG (50000 UNIT) CAPS capsule TAKE 1 CAPSULE BY MOUTH ONCE A WEEK FOR 12 WEEKS 12 capsule 0 03/03/2021   amoxicillin-clavulanate (AUGMENTIN) 875-125 MG tablet TAKE 1 TABLET BY MOUTH TWICE A DAY (Patient not taking: No sig reported) 20 tablet 0 Completed Course   fluticasone (FLONASE) 50 MCG/ACT nasal spray Place 1 spray into both nostrils daily. (Patient not taking: No sig reported) 16 g 1 Not Taking   losartan (COZAAR) 25 MG tablet Take 1 tablet by mouth once a day (Patient not taking: Reported on 02/25/2021) 90 tablet 1 Not Taking   Norethin Ace-Eth Estrad-FE (MINASTRIN 24 FE) 1-20 MG-MCG(24) CHEW Chew and swallow 1 tablet by mouth every day (Patient not taking: Reported on 02/25/2021) 84 tablet 0 Not Taking   Norethin Ace-Eth Estrad-FE 1-20 MG-MCG(24) CHEW CHEW AND SWALLOW 1 TABLET BY MOUTH EVERY DAY (Patient not taking: Reported on 02/25/2021) 84 tablet 4 Not Taking   phentermine (ADIPEX-Holland) 37.5 MG tablet TAKE 1 TABLET BY MOUTH 30 MINUTES BEFORE BREAKFAST (Patient not taking: Reported on 02/25/2021) 30 tablet 0 Not Taking   phentermine 37.5 MG capsule TAKE 1 CAPSULE BY MOUTH EVERY MORNING (Patient not taking: Reported on 02/25/2021) 30 capsule 1 Not Taking  Mackenzie Holland 03/04/2021,10:34 AM

## 2021-03-05 ENCOUNTER — Other Ambulatory Visit (HOSPITAL_COMMUNITY): Payer: Self-pay

## 2021-03-05 ENCOUNTER — Encounter (HOSPITAL_COMMUNITY): Payer: Self-pay | Admitting: General Surgery

## 2021-03-05 LAB — CBC WITH DIFFERENTIAL/PLATELET
Abs Immature Granulocytes: 0.04 10*3/uL (ref 0.00–0.07)
Basophils Absolute: 0 10*3/uL (ref 0.0–0.1)
Basophils Relative: 0 %
Eosinophils Absolute: 0 10*3/uL (ref 0.0–0.5)
Eosinophils Relative: 0 %
HCT: 37.3 % (ref 36.0–46.0)
Hemoglobin: 12.9 g/dL (ref 12.0–15.0)
Immature Granulocytes: 0 %
Lymphocytes Relative: 16 %
Lymphs Abs: 2.2 10*3/uL (ref 0.7–4.0)
MCH: 30.8 pg (ref 26.0–34.0)
MCHC: 34.6 g/dL (ref 30.0–36.0)
MCV: 89 fL (ref 80.0–100.0)
Monocytes Absolute: 0.9 10*3/uL (ref 0.1–1.0)
Monocytes Relative: 6 %
Neutro Abs: 10.8 10*3/uL — ABNORMAL HIGH (ref 1.7–7.7)
Neutrophils Relative %: 78 %
Platelets: 185 10*3/uL (ref 150–400)
RBC: 4.19 MIL/uL (ref 3.87–5.11)
RDW: 13.6 % (ref 11.5–15.5)
WBC: 13.9 10*3/uL — ABNORMAL HIGH (ref 4.0–10.5)
nRBC: 0 % (ref 0.0–0.2)

## 2021-03-05 LAB — COMPREHENSIVE METABOLIC PANEL
ALT: 21 U/L (ref 0–44)
AST: 23 U/L (ref 15–41)
Albumin: 3.7 g/dL (ref 3.5–5.0)
Alkaline Phosphatase: 51 U/L (ref 38–126)
Anion gap: 7 (ref 5–15)
BUN: 9 mg/dL (ref 6–20)
CO2: 25 mmol/L (ref 22–32)
Calcium: 8.9 mg/dL (ref 8.9–10.3)
Chloride: 109 mmol/L (ref 98–111)
Creatinine, Ser: 0.68 mg/dL (ref 0.44–1.00)
GFR, Estimated: >60 mL/min (ref >60)
Glucose, Bld: 121 mg/dL — ABNORMAL HIGH (ref 70–99)
Potassium: 3.4 mmol/L — ABNORMAL LOW (ref 3.5–5.1)
Sodium: 141 mmol/L (ref 135–145)
Total Bilirubin: 0.4 mg/dL (ref 0.3–1.2)
Total Protein: 7 g/dL (ref 6.5–8.1)

## 2021-03-05 LAB — SURGICAL PATHOLOGY

## 2021-03-05 MED ORDER — ACETAMINOPHEN 500 MG PO TABS: 1000.0000 mg | ORAL_TABLET | Freq: Three times a day (TID) | ORAL | 0 refills | Status: AC

## 2021-03-05 MED ORDER — ONDANSETRON 4 MG PO TBDP
4.0000 mg | ORAL_TABLET | Freq: Four times a day (QID) | ORAL | 0 refills | Status: AC | PRN
  Filled 2021-03-05: qty 20, 5d supply, fill #0

## 2021-03-05 MED ORDER — OXYCODONE HCL 5 MG PO TABS
5.0000 mg | ORAL_TABLET | Freq: Four times a day (QID) | ORAL | 0 refills | Status: AC | PRN
  Filled 2021-03-05: qty 10, 3d supply, fill #0

## 2021-03-05 MED ORDER — ESCITALOPRAM OXALATE 20 MG PO TABS
20.0000 mg | ORAL_TABLET | Freq: Every day | ORAL | Status: DC
  Administered 2021-03-05: 20 mg via ORAL
  Filled 2021-03-05: qty 1

## 2021-03-05 MED ORDER — BUSPIRONE HCL 5 MG PO TABS
10.0000 mg | ORAL_TABLET | Freq: Two times a day (BID) | ORAL | Status: DC
  Administered 2021-03-05: 10 mg via ORAL
  Filled 2021-03-05: qty 2

## 2021-03-05 MED ORDER — PANTOPRAZOLE SODIUM 40 MG PO TBEC
40.0000 mg | DELAYED_RELEASE_TABLET | Freq: Every day | ORAL | 0 refills | Status: DC
  Filled 2021-03-05: qty 90, 90d supply, fill #0

## 2021-03-05 MED ORDER — LOSARTAN POTASSIUM 25 MG PO TABS
25.0000 mg | ORAL_TABLET | Freq: Every day | ORAL | Status: DC
  Administered 2021-03-05: 25 mg via ORAL
  Filled 2021-03-05: qty 1

## 2021-03-05 NOTE — Progress Notes (Signed)
Discharge packet provided to pt, went over with pt. All questions answered. IV removed. Pt left ambulatory with staff member. Husband at bedside.

## 2021-03-05 NOTE — Progress Notes (Signed)
Patient alert and oriented, Post op day 1.  Provided support and encouragement.  Encouraged pulmonary toilet, ambulation and small sips of liquids.  All questions answered.  Will continue to monitor. 

## 2021-03-05 NOTE — Anesthesia Postprocedure Evaluation (Signed)
Anesthesia Post Note  Patient: Mackenzie Holland  Procedure(s) Performed: LAPAROSCOPIC GASTRIC SLEEVE RESECTION UPPER GI ENDOSCOPY     Patient location during evaluation: PACU Anesthesia Type: General Level of consciousness: awake and alert Pain management: pain level controlled Vital Signs Assessment: post-procedure vital signs reviewed and stable Respiratory status: spontaneous breathing, nonlabored ventilation, respiratory function stable and patient connected to nasal cannula oxygen Cardiovascular status: blood pressure returned to baseline and stable Postop Assessment: no apparent nausea or vomiting Anesthetic complications: no   No notable events documented.  Last Vitals:  Vitals:   03/05/21 0532 03/05/21 0924  BP: (!) 155/86 (!) 144/84  Pulse: 79 72  Resp: 17 18  Temp: 36.9 C 36.9 C  SpO2: 99% 100%    Last Pain:  Vitals:   03/05/21 0924  TempSrc: Oral  PainSc:    Pain Goal:                   Jaeceon Michelin

## 2021-03-05 NOTE — Discharge Instructions (Signed)

## 2021-03-05 NOTE — Progress Notes (Signed)
Nutrition Education Note ° °Received consult for diet education for patient s/p bariatric surgery. ° °Discussed 2 week post op diet with pt. Emphasized that liquids must be non carbonated, non caffeinated, and sugar free. Fluid goals discussed. Pt to follow up with outpatient bariatric RD for further diet progression after 2 weeks. Multivitamins and minerals also reviewed. Teach back method used, pt expressed understanding, expect good compliance. ° °If nutrition issues arise, please consult RD. ° °Genaro Bekker, MS, RD, LDN °Inpatient Clinical Dietitian °Contact information available via Amion ° ° °

## 2021-03-05 NOTE — Progress Notes (Signed)
   Progress Note: Metabolic and Bariatric Surgery Service   Chief Complaint/Subjective: Gas pain issues and right sided abdominal pain  Objective: Vital signs in last 24 hours: Temp:  [98 F (36.7 C)-98.8 F (37.1 C)] 98.5 F (36.9 C) (09/07 0532) Pulse Rate:  [64-90] 79 (09/07 0532) Resp:  [12-20] 17 (09/07 0532) BP: (118-168)/(67-94) 155/86 (09/07 0532) SpO2:  [93 %-100 %] 99 % (09/07 0532) Last BM Date: 03/04/21  Intake/Output from previous day: 09/06 0701 - 09/07 0700 In: 3836.3 [P.O.:540; I.V.:3196.3; IV Piggyback:100] Out: 3425 [Urine:3400; Blood:25] Intake/Output this shift: No intake/output data recorded.  Lungs: nonlabored  Cardiovascular: RRR  Abd: soft, appropriately tender, incisions c/d/di  Extremities: no edema  Neuro: AOx4  Lab Results: CBC  Recent Labs    03/04/21 1027 03/05/21 0421  WBC  --  13.9*  HGB 13.0 12.9  HCT 38.2 37.3  PLT  --  185   BMET Recent Labs    03/05/21 0421  NA 141  K 3.4*  CL 109  CO2 25  GLUCOSE 121*  BUN 9  CREATININE 0.68  CALCIUM 8.9   PT/INR No results for input(s): LABPROT, INR in the last 72 hours. ABG No results for input(s): PHART, HCO3 in the last 72 hours.  Invalid input(s): PCO2, PO2  Studies/Results:  Anti-infectives: Anti-infectives (From admission, onward)    Start     Dose/Rate Route Frequency Ordered Stop   03/04/21 0800  cefoTEtan (CEFOTAN) 2 g in sodium chloride 0.9 % 100 mL IVPB        2 g 200 mL/hr over 30 Minutes Intravenous On call to O.R. 03/04/21 0746 03/04/21 0903       Medications: Scheduled Meds:  acetaminophen  1,000 mg Oral Q8H   Or   acetaminophen (TYLENOL) oral liquid 160 mg/5 mL  1,000 mg Oral Q8H   busPIRone  10 mg Oral BID   enoxaparin (LOVENOX) injection  30 mg Subcutaneous Q12H   escitalopram  20 mg Oral Daily   losartan  25 mg Oral Daily   Ensure Max Protein  2 oz Oral Q2H   scopolamine  1 patch Transdermal On Call to OR   Continuous Infusions:   dextrose 5 % and 0.45% NaCl 125 mL/hr at 03/05/21 0626   famotidine (PEPCID) IV 20 mg (03/04/21 2116)   PRN Meds:.hydrALAZINE, morphine injection, ondansetron (ZOFRAN) IV, oxyCODONE, simethicone  Assessment/Plan: Patient Active Problem List   Diagnosis Date Noted   Class II obesity 03/04/2021   Hypertension goal BP (blood pressure) < 130/80 01/01/2019   Subacute sinusitis 01/01/2019   History of echocardiogram 01/01/2019   Borderline hypercholesterolemia 12/11/2018   s/p Procedure(s): LAPAROSCOPIC GASTRIC SLEEVE RESECTION UPPER GI ENDOSCOPY 03/04/2021  Continue to ambulate Possible afternoon discharge if gas pain improves  Disposition:  LOS: 1 day  The patient should be discharged from the hospital today  Mickeal Skinner, MD [phone] [facility]

## 2021-03-05 NOTE — Consult Note (Signed)
   [facility] Inpatient Consult   03/05/2021  ARIANDA PILGREEN 10-07-1980 [id number]   [place] Organization [ACO] Patient: [place] plan  Patient is currently assigned to a [facility] Telephonic RN Care Coordinator for the [facility] plan to offer support in post hospital transition plan of care to focus on disease management and community resource support.     Patient will receive a post hospital call and will be evaluated for assessments and disease process education.    1110 am:  Spoke with the patient via phone and verified HIPAA and contact information to let her know post hospital follow up plan.  Patient verbalized understanding for follow up.  Plan: Patient will be followed by [place] Coordinator.   For additional questions or referrals please contact:   Natividad Brood, RN BSN [facility] Liaison  [phone] business mobile phone Toll free office [phone]  Fax number: [phone] [email or web address]

## 2021-03-05 NOTE — Progress Notes (Signed)
Patient alert and oriented, pain is controlled. Patient is tolerating fluids, advanced to protein shake today, patient is tolerating well.  Reviewed Gastric sleeve discharge instructions with patient and patient is able to articulate understanding.  Provided information on BELT program, Support Group and [facility] outpatient pharmacy. All questions answered, will continue to monitor.  

## 2021-03-05 NOTE — Progress Notes (Signed)
24hr fluid recall: 655m.  Per dehydration protocol, will f/u with pt within one week post op.

## 2021-03-06 ENCOUNTER — Other Ambulatory Visit: Payer: Self-pay | Admitting: *Deleted

## 2021-03-06 ENCOUNTER — Other Ambulatory Visit (HOSPITAL_COMMUNITY): Payer: Self-pay

## 2021-03-06 NOTE — Patient Outreach (Signed)
[facility]) Care Management  03/06/2021  Mackenzie Holland 01-14-1981 [id number]   Transition of care telephone call  Referral received:03/04/21 Initial outreach:03/06/21 Insurance: [facility]   Initial unsuccessful telephone call to patient's preferred number in order to complete transition of care assessment; no answer, left HIPAA compliant voicemail message requesting return call.   Objective: Per the electronic medical record, Mrs. Mackenzie Holland was hospitalized at [facility] 9/6-9/7 for Laparoscopic gastric sleeve . Comorbidities include: Hypertension , obesity, borderline cholesterolemia. She  was discharged to home on 03/05/21 without the need for home health services or durable medical equipment per the discharge summary.   Plan: This RNCM will route unsuccessful outreach letter with [place] Management pamphlet and 24 hour Nurse Advice Line Magnet to [place] Management clinical pool to be mailed to patient's home address. This RNCM will attempt another outreach within 4 business days.    Joylene Draft, RN, BSN  [place] Management Coordinator  [phone]- Mobile [phone]- Toll Free Main Office

## 2021-03-10 ENCOUNTER — Telehealth (HOSPITAL_COMMUNITY): Payer: Self-pay | Admitting: *Deleted

## 2021-03-10 NOTE — Telephone Encounter (Signed)
1.  Tell me about your pain and pain management? Pt denies any pain.  2.  Let's talk about fluid intake.  How much total fluid are you taking in? Pt states that s/he is getting in more than 64oz of fluid including protein shakes, bottled water, Gatorade Zero and broth.  3.  How much protein have you taken in the last 2 days? Pt states s/he is meeting her goal of 60g of protein each day with the protein shakes.  4.  Have you had nausea?  Tell me about when have experienced nausea and what you did to help? Pt denies nausea.   5.  Has the frequency or color changed with your urine? Pt states that she is urinating "fine" with no changes in frequency or urgency.     6.  Tell me what your incisions look like? "Incisions look fine". Pt denies a fever, chills.  Pt states incisions are not swollen, open, or draining.  Pt encouraged to call [facility] if incisions change.   7.  Have you been passing gas? BM? Pt states that she is having BMs. Last BM 03/10/21.    8.  If a problem or question were to arise who would you call?  Do you know contact numbers for [place], [facility], and [facility]? Pt denies dehydration symptoms.  Pt can describe s/sx of dehydration.  Pt knows to call [facility] for surgical, [facility] for nutrition, and [place] for non-urgent questions or concerns.   9.  How has the walking going? Pt states she is walking around and able to be active without difficulty.   10. Are you still using your incentive spirometer?  If so, how often? Pt states that she is doing it "a couple of times a day". Pt encouraged to use incentive spirometer, at least 10x every hour while awake until she sees the surgeon.  11.  How are your vitamins and calcium going?  How are you taking them? Pt states that she is taking her supplements and vitamins without difficulty.  Reminded patient that the first 30 days post-operatively are important for successful recovery.  Practice good hand hygiene, wearing a mask when appropriate (since optional  in most places), and minimizing exposure to people who live outside of the home, especially if they are exhibiting any respiratory, GI, or illness-like symptoms.

## 2021-03-11 ENCOUNTER — Other Ambulatory Visit: Payer: Self-pay | Admitting: *Deleted

## 2021-03-11 ENCOUNTER — Encounter: Payer: Self-pay | Admitting: *Deleted

## 2021-03-11 NOTE — Patient Outreach (Signed)
[facility]) Care Management  03/11/2021  SHAUNI MCCORKELL 1981/06/06 [id number]  Transition of care call/case closure   Referral received:03/04/21 Initial outreach:03/06/21 Insurance: [facility]   #2 Call Attempt  Subjective: 2nd attempt successful telephone call to patient's preferred number in order to complete transition of care assessment; 2 HIPAA identifiers verified. Explained purpose of call and completed transition of care assessment.  Eveline reports that she is doing well. She denies post-operative problems, says surgical incisions are unremarkable, states surgical pain well managed with prescribed medications. She reports tolerating  liquid diet with  about 90 ounces of fluid by end of day with 60 gram of protein, denies nausea. She denies  bowel or bladder problems, having bowel movements.She reports tolerating walking in home back to usual, reports using incentive spirometer at least twice daily, reinforced.    Reviewed accessing the following [place] Benefits : She discussed history of hypertension managing  and under control, discussed [place] chronic disease management program with health coach.   She does  have the hospital indemnity plan and made contact to file a claim.  She uses a Company secretary outpatient pharmacy at [facility] .      Objective:  Per the electronic medical record, Mrs. Cherril Shawn was hospitalized at [facility] 9/6-9/7 for Laparoscopic gastric sleeve . Comorbidities include: Hypertension , obesity, borderline cholesterolemia. She  was discharged to home on 03/05/21 without the need for home health services or durable medical equipment per the discharge summary.  Assessment:  Patient voices good understanding of all discharge instructions.  See transition of care flowsheet for assessment details.   Plan:  Reviewed hospital discharge diagnosis of Laparoscopic gastric sleeve  and discharge treatment plan using  hospital discharge instructions, assessing medication adherence, reviewing problems requiring provider notification, and discussing the importance of follow up with surgeon, primary care provider and/or specialists as directed.  Reviewed [place] healthy lifestyle program information to receive discounted premium for  2023   Step 1: Get  your annual physical  Step 2: Complete your health assessment  Step 3:Identify your current health status and complete the corresponding action step between June 29, 2020 and February 27, 2021.       No ongoing care management needs identified so will close case to [place] Management services and route successful outreach letter with [place] Management pamphlet and 24 Hour Nurse Line Magnet to [place] Management clinical pool to be mailed to patient's home address.  Thanked patient for their services to [facility].  Joylene Draft, RN, BSN  [place] Management Coordinator  [phone]- Mobile [phone]- Toll Free Main Office

## 2021-03-18 ENCOUNTER — Other Ambulatory Visit: Payer: Self-pay

## 2021-03-18 ENCOUNTER — Encounter: Payer: 59 | Attending: General Surgery | Admitting: Skilled Nursing Facility1

## 2021-03-18 DIAGNOSIS — E669 Obesity, unspecified: Secondary | ICD-10-CM | POA: Insufficient documentation

## 2021-03-19 NOTE — Progress Notes (Signed)
2 Week Post-Operative Nutrition Class   Patient was seen on 03/18/2021 for Post-Operative Nutrition education at the [facility].     Surgery date: 03/04/2021 Surgery type: sleeve Start weight at [facility]: 259.9 Weight today: 245 pounds Bowel Habits: Every day to every other day no complaints   Body Composition Scale 03/19/2021  Current Body Weight 245  Total Body Fat % 41.8  Visceral Fat 12  Fat-Free Mass % 58.1   Total Body Water % 43.5  Muscle-Mass lbs 35.4  BMI 37.2  Body Fat Displacement          Torso  lbs 63.4         Left Leg  lbs 12.6         Right Leg  lbs 12.6         Left Arm  lbs 6.3         Right Arm   lbs 6.3      The following the learning objectives were met by the patient during this course: Identifies Phase 3 (Soft, High Proteins) Dietary Goals and will begin from 2 weeks post-operatively to 2 months post-operatively Identifies appropriate sources of fluids and proteins  Identifies appropriate fat sources and healthy verses unhealthy fat types   States protein recommendations and appropriate sources post-operatively Identifies the need for appropriate texture modifications, mastication, and bite sizes when consuming solids Identifies appropriate fat consumption and sources Identifies appropriate multivitamin and calcium sources post-operatively Describes the need for physical activity post-operatively and will follow MD recommendations States when to call healthcare provider regarding medication questions or post-operative complications   Handouts given during class include: Phase 3A: Soft, High Protein Diet Handout Phase 3 High Protein Meals Healthy Fats   Follow-Up Plan: Patient will follow-up at [facility] in 6 weeks for 2 month post-op nutrition visit for diet advancement per MD.

## 2021-03-24 ENCOUNTER — Telehealth: Payer: Self-pay | Admitting: Skilled Nursing Facility1

## 2021-03-24 NOTE — Telephone Encounter (Signed)
RD called pt to verify fluid intake once starting soft, solid proteins 2 week post-bariatric surgery.   Daily Fluid intake:  Daily Protein intake: Bowel Habits:   Concerns/issues:    LVM 

## 2021-03-25 ENCOUNTER — Other Ambulatory Visit (HOSPITAL_COMMUNITY): Payer: Self-pay

## 2021-03-26 ENCOUNTER — Other Ambulatory Visit (HOSPITAL_COMMUNITY): Payer: Self-pay

## 2021-03-26 MED ORDER — LOSARTAN POTASSIUM 25 MG PO TABS
25.0000 mg | ORAL_TABLET | Freq: Every day | ORAL | 1 refills | Status: DC
  Filled 2021-03-26: qty 90, 90d supply, fill #0
  Filled 2021-06-28: qty 90, 90d supply, fill #1

## 2021-03-28 NOTE — Discharge Summary (Signed)
Physician Discharge Summary  Mackenzie Holland GEZ:[id number] DOB: 1981/04/17 DOA: 03/04/2021  PCP: Zara Chess, NP  Admit date: 03/04/2021 Discharge date: 03/28/2021  Recommendations for Outpatient Follow-up:   (include homehealth, outpatient follow-up instructions, specific recommendations for PCP to follow-up on, etc.)   Follow-up Information     Elin Seats, Arta Bruce, MD. Go on 04/03/2021.   Specialty: General Surgery Why: at 9:10am.  Please arrive 15 minutes prior to your appointment time.  Thank you. Contact information: [place] [address] [phone]         Surgery, [place]. Go on 04/24/2021.   Specialty: General Surgery Why: at 9am with Dr. Kieth Brightly. Please arrive 15 minutes prior to your appointment time. Thank you. Contact information: [place] [address] [phone]                Discharge Diagnoses:  Active Problems:   Class II obesity   Surgical Procedure: laparoscopic sleeve gastrectomy, upper endoscopy  Discharge Condition: Good Disposition: Home  Diet recommendation: Postoperative sleeve gastrectomy diet (liquids only)  Filed Weights   03/04/21 0750  Weight: 117.1 kg     Hospital Course:  The patient was admitted after undergoing laparoscopic sleeve gastrectomy. POD 0 she ambulated well. POD 1 she was started on the water diet protocol and tolerated 400 ml in the first shift. Once meeting the water amount she was advanced to bariatric protein shakes which they tolerated and were discharged home POD 1.  Treatments: surgery: laparoscopic sleeve gastrectomy  Discharge Instructions  Discharge Instructions     Ambulate hourly while awake   Complete by: As directed    Call MD for:  difficulty breathing, headache or visual disturbances   Complete by: As directed    Call MD for:  persistant dizziness or light-headedness   Complete by: As directed    Call MD for:   persistant nausea and vomiting   Complete by: As directed    Call MD for:  redness, tenderness, or signs of infection (pain, swelling, redness, odor or green/yellow discharge around incision site)   Complete by: As directed    Call MD for:  severe uncontrolled pain   Complete by: As directed    Call MD for:  temperature >101 F   Complete by: As directed    Diet bariatric full liquid   Complete by: As directed    Discharge wound care:   Complete by: As directed    Remove Bandaids tomorrow, ok to shower tomorrow. Steristrips may fall off in 1-3 weeks.   Incentive spirometry   Complete by: As directed    Perform hourly while awake      Allergies as of 03/05/2021   No Known Allergies      Medication List     STOP taking these medications    amoxicillin-clavulanate 875-125 MG tablet Commonly known as: AUGMENTIN   fluticasone 50 MCG/ACT nasal spray Commonly known as: FLONASE   Norethin Ace-Eth Estrad-FE 1-20 MG-MCG(24) Chew Commonly known as: Minastrin 24 Fe   phentermine 37.5 MG capsule   phentermine 37.5 MG tablet Commonly known as: ADIPEX-P       TAKE these medications    busPIRone 10 MG tablet Commonly known as: BUSPAR Take 1 tablet by mouth 2 times a day as needed   diclofenac 75 MG EC tablet Commonly known as: VOLTAREN Take 1 tablet by mouth 2 times a day with a meal Notes to patient: Avoid NSAIDs  for 6-8 weeks after surgery   docusate sodium 100 MG capsule Commonly known as: COLACE Take 100 mg by mouth daily.   escitalopram 20 MG tablet Commonly known as: LEXAPRO Take 1 tablet by mouth daily   losartan 25 MG tablet Commonly known as: COZAAR TAKE ONE TABLET BY MOUTH DAILY. What changed: Another medication with the same name was removed. Continue taking this medication, and follow the directions you see here.   ondansetron 4 MG disintegrating tablet Commonly known as: ZOFRAN-ODT Dissolve 1 tablet (4 mg total) by mouth every 6 (six) hours as needed  for nausea or vomiting.   oxyCODONE 5 MG immediate release tablet Commonly known as: Oxy IR/ROXICODONE Take 1 tablet (5 mg total) by mouth every 6 (six) hours as needed for severe pain.   pantoprazole 40 MG tablet Commonly known as: PROTONIX Take 1 tablet (40 mg total) by mouth daily.   Vitamin D (Ergocalciferol) 1.25 MG (50000 UNIT) Caps capsule Commonly known as: DRISDOL TAKE 1 CAPSULE BY MOUTH ONCE A WEEK FOR 12 WEEKS       ASK your doctor about these medications    acetaminophen 500 MG tablet Commonly known as: TYLENOL Take 2 tablets (1,000 mg total) by mouth every 8 (eight) hours for 5 days. Ask about: Should I take this medication?               Discharge Care Instructions  (From admission, onward)           Start     Ordered   03/05/21 0000  Discharge wound care:       Comments: Remove Bandaids tomorrow, ok to shower tomorrow. Steristrips may fall off in 1-3 weeks.   03/05/21 1237            Follow-up Information     Latyra Jaye, Arta Bruce, MD. Go on 04/03/2021.   Specialty: General Surgery Why: at 9:10am.  Please arrive 15 minutes prior to your appointment time.  Thank you. Contact information: [place] [address] [phone]         Surgery, [place]. Go on 04/24/2021.   Specialty: General Surgery Why: at 9am with Dr. Kieth Brightly. Please arrive 15 minutes prior to your appointment time. Thank you. Contact information: [place] [address] [phone]                  The results of significant diagnostics from this hospitalization (including imaging, microbiology, ancillary and laboratory) are listed below for reference.    Significant Diagnostic Studies: No results found.  Labs: Basic Metabolic Panel: No results for input(s): NA, K, CL, CO2, GLUCOSE, BUN, CREATININE, CALCIUM, MG, PHOS in the last 168 hours. Liver Function Tests: No results for input(s): AST,  ALT, ALKPHOS, BILITOT, PROT, ALBUMIN in the last 168 hours.  CBC: No results for input(s): WBC, NEUTROABS, HGB, HCT, MCV, PLT in the last 168 hours.  CBG: No results for input(s): GLUCAP in the last 168 hours.  Active Problems:   Class II obesity   VTE plan: no chemical prophylaxis recommended ([email or web address])  Time coordinating discharge: 15 min

## 2021-04-24 DIAGNOSIS — F411 Generalized anxiety disorder: Secondary | ICD-10-CM | POA: Diagnosis not present

## 2021-04-24 DIAGNOSIS — I159 Secondary hypertension, unspecified: Secondary | ICD-10-CM | POA: Diagnosis not present

## 2021-04-29 ENCOUNTER — Encounter: Payer: 59 | Attending: General Surgery | Admitting: Skilled Nursing Facility1

## 2021-04-29 ENCOUNTER — Other Ambulatory Visit: Payer: Self-pay

## 2021-04-29 DIAGNOSIS — E669 Obesity, unspecified: Secondary | ICD-10-CM | POA: Insufficient documentation

## 2021-04-29 NOTE — Progress Notes (Signed)
Bariatric Nutrition Follow-Up Visit Medical Nutrition Therapy   NUTRITION ASSESSMENT    Surgery date: 03/04/2021 Surgery type: sleeve Start weight at [facility]: 259.9 Weight today: 245 pounds   Body Composition Scale 03/19/2021 04/29/2021  Current Body Weight 245 230.6  Total Body Fat % 41.8 40.1  Visceral Fat 12 10  Fat-Free Mass % 58.1 59.8   Total Body Water % 43.5 44.4  Muscle-Mass lbs 35.4 35.3  BMI 37.2 35  Body Fat Displacement           Torso  lbs 63.4 57.3         Left Leg  lbs 12.6 11.4         Right Leg  lbs 12.6 11.4         Left Arm  lbs 6.3 5.7         Right Arm   lbs 6.3 5.7   Clinical  Medical hx: anxiety, depression, GERD, HTN, hyperlipidemia  Medications: see list  Labs: vitmain D 59.1 Notable signs/symptoms: migraines once a month, hip dysplasia, occassionally constipation  Any previous deficiencies? No   Lifestyle & Dietary Hx  Pt state she has been having painful reflux.   Pt states she cut out red sauce since experiencing reflux and will cut out spicey foods.  Pt states she chews er foods very well takes small bites and does not eat within 3 hours of bed.  Pt states he logs her food every day. Pt states she wants to increase her activity, is just working on what she is going to do within the context of painful hip dysplasia. Pt states she realized she used to bored eat a lot so recognizing that has been really insightful.   Estimated daily fluid intake: 64+ oz Estimated daily protein intake: 60+ g Supplements: multi and calcium  Current average weekly physical activity: walking 2 times a week 1-1.5 miles  24-Hr Dietary Recall First Meal: coffee + protein shake + collegan Snack:  greek yogurt or eggs + pimento cheese or egg wrap with cheese  Second Meal: cheese stick + pepperoni or cheese and [place] suasage  Snack:  honey roasted peanuts or cheese or cashews  Third Meal: taco meat and cheese or butt or stuffed chicken Snack:  Beverages: water,  coffee, water + flavoring   Post-Op Goals/ Signs/ Symptoms Using straws: no Drinking while eating: no Chewing/swallowing difficulties: no Changes in vision: no Changes to mood/headaches: no Hair loss/changes to skin/nails: no Difficulty focusing/concentrating: no Sweating: no Limb weakness: no Dizziness/lightheadedness: no Palpitations: no  Carbonated/caffeinated beverages: no N/V/D/C/Gas: reflux  Abdominal pain: no Dumping syndrome: no    NUTRITION DIAGNOSIS  Overweight/obesity (Lamb-3.3) related to past poor dietary habits and physical inactivity as evidenced by completed bariatric surgery and following dietary guidelines for continued weight loss and healthy nutrition status.     NUTRITION INTERVENTION Nutrition counseling (C-1) and education (E-2) to facilitate bariatric surgery goals, including: Diet advancement to the next phase (phase 4) now including non starchy  The importance of consuming adequate calories as well as certain nutrients daily due to the body's need for essential vitamins, minerals, and fats The importance of daily physical activity and to reach a goal of at least 150 minutes of moderate to vigorous physical activity weekly (or as directed by their physician) due to benefits such as increased musculature and improved lab values The importance of intuitive eating specifically learning hunger-satiety cues and understanding the importance of learning a new body: The importance of mindful eating  to avoid grazing behaviors  Goals: -Continue to aim for a minimum of 64 fluid ounces 7 days a week with at least 30 ounces being plain water  -Eat non-starchy vegetables 2 times a day 7 days a week  -Start out with soft cooked vegetables today and tomorrow; if tolerated begin to eat raw vegetables or cooked including salads  -Eat your 3 ounces of protein first then start in on your non-starchy vegetables; once you understand how much of your meal leads to satisfaction  and not full while still eating 3 ounces of protein and non-starchy vegetables you can eat them in any order   -Continue to aim for 30 minutes of activity at least 5 times a week  -Do NOT cook with/add to your food: alfredo sauce, cheese sauce, barbeque sauce, ketchup, fat back, butter, bacon grease, grease, Crisco, OR SUGAR  -try alkaline water  Handouts Provided Include  Phase 4  Learning Style & Readiness for Change Teaching method utilized: Visual & Auditory  Demonstrated degree of understanding via: Teach Back  Readiness Level: action Barriers to learning/adherence to lifestyle change: reflux  RD's Notes for Next Visit Assess adherence to pt chosen goals  MONITORING & EVALUATION Dietary intake, weekly physical activity, body weight  Next Steps Patient is to follow-up in 3 months

## 2021-05-12 ENCOUNTER — Other Ambulatory Visit (HOSPITAL_COMMUNITY): Payer: Self-pay

## 2021-05-12 DIAGNOSIS — J019 Acute sinusitis, unspecified: Secondary | ICD-10-CM | POA: Diagnosis not present

## 2021-05-12 MED ORDER — AZITHROMYCIN 250 MG PO TABS
ORAL_TABLET | ORAL | 0 refills | Status: AC
  Filled 2021-05-12: qty 6, 5d supply, fill #0

## 2021-05-16 ENCOUNTER — Other Ambulatory Visit (HOSPITAL_COMMUNITY): Payer: Self-pay

## 2021-05-19 ENCOUNTER — Other Ambulatory Visit (HOSPITAL_COMMUNITY): Payer: Self-pay

## 2021-05-19 DIAGNOSIS — K912 Postsurgical malabsorption, not elsewhere classified: Secondary | ICD-10-CM | POA: Diagnosis not present

## 2021-05-19 DIAGNOSIS — E569 Vitamin deficiency, unspecified: Secondary | ICD-10-CM | POA: Diagnosis not present

## 2021-05-19 DIAGNOSIS — Z9884 Bariatric surgery status: Secondary | ICD-10-CM | POA: Diagnosis not present

## 2021-05-19 DIAGNOSIS — Z6834 Body mass index (BMI) 34.0-34.9, adult: Secondary | ICD-10-CM | POA: Diagnosis not present

## 2021-05-19 DIAGNOSIS — E6609 Other obesity due to excess calories: Secondary | ICD-10-CM | POA: Diagnosis not present

## 2021-05-19 MED ORDER — PANTOPRAZOLE SODIUM 40 MG PO TBEC
40.0000 mg | DELAYED_RELEASE_TABLET | Freq: Every day | ORAL | 3 refills | Status: DC
  Filled 2021-05-19: qty 90, 90d supply, fill #0
  Filled 2021-09-23: qty 90, 90d supply, fill #1
  Filled 2021-12-21: qty 90, 90d supply, fill #2
  Filled 2022-03-21: qty 90, 90d supply, fill #3

## 2021-06-28 ENCOUNTER — Other Ambulatory Visit (HOSPITAL_COMMUNITY): Payer: Self-pay

## 2021-06-29 ENCOUNTER — Other Ambulatory Visit (HOSPITAL_COMMUNITY): Payer: Self-pay

## 2021-06-30 ENCOUNTER — Other Ambulatory Visit (HOSPITAL_COMMUNITY): Payer: Self-pay

## 2021-07-01 ENCOUNTER — Other Ambulatory Visit (HOSPITAL_COMMUNITY): Payer: Self-pay

## 2021-07-01 MED ORDER — BUSPIRONE HCL 10 MG PO TABS
ORAL_TABLET | ORAL | 1 refills | Status: DC
  Filled 2021-07-01: qty 180, 90d supply, fill #0
  Filled 2021-09-23: qty 180, 90d supply, fill #1

## 2021-07-01 MED ORDER — LOSARTAN POTASSIUM 25 MG PO TABS
ORAL_TABLET | ORAL | 1 refills | Status: AC
  Filled 2021-07-01 – 2021-09-23 (×2): qty 90, 90d supply, fill #0
  Filled 2021-12-21: qty 90, 90d supply, fill #1

## 2021-07-08 ENCOUNTER — Encounter: Payer: 59 | Attending: General Surgery | Admitting: Skilled Nursing Facility1

## 2021-07-08 ENCOUNTER — Other Ambulatory Visit: Payer: Self-pay

## 2021-07-08 DIAGNOSIS — E669 Obesity, unspecified: Secondary | ICD-10-CM

## 2021-07-09 NOTE — Progress Notes (Signed)
Follow-up visit:  Post-Operative sleeve Surgery  Medical Nutrition Therapy:  Appt start time: 6:00pm end time:  7:00pm  Primary concerns today: Post-operative Bariatric Surgery Nutrition Management 6 Month Post-Op Class  Surgery date: 03/04/2021 Surgery type: sleeve Start weight at [facility]: 259.9 Weight today: 206.5 pounds   Body Composition Scale 03/19/2021 04/29/2021 07/09/2021  Current Body Weight 245 230.6 206.5  Total Body Fat % 41.8 40.1 36.6  Visceral Fat 12 10 9   Fat-Free Mass % 58.1 59.8 63.3   Total Body Water % 43.5 44.4 46.1  Muscle-Mass lbs 35.4 35.3 35  BMI 37.2 35 31.3  Body Fat Displacement            Torso  lbs 63.4 57.3 46.8         Left Leg  lbs 12.6 11.4 9.3         Right Leg  lbs 12.6 11.4 9.3         Left Arm  lbs 6.3 5.7 4.6         Right Arm   lbs 6.3 5.7 4.6   Clinical  Medical hx: anxiety, depression, GERD, HTN, hyperlipidemia  Medications: see list  Labs: vitmain D 59.1 Notable signs/symptoms: migraines once a month, hip dysplasia, occassionally constipation  Any previous deficiencies? No    Information Reviewed/ Discussed During Appointment: -Review of composition scale numbers -Fluid requirements (64-100 ounces) -Protein requirements (60-80g) -Strategies for tolerating diet -Advancement of diet to include Starchy vegetables -Barriers to inclusion of new foods -Inclusion of appropriate multivitamin and calcium supplements  -Exercise recommendations   Fluid intake: adequate   Medications: See List Supplementation: appropriate    Using straws: no Drinking while eating: no Having you been chewing well: yes Chewing/swallowing difficulties: no Changes in vision: no Changes to mood/headaches: no Hair loss/Cahnges to skin/Changes to nails: no Any difficulty focusing or concentrating: no Sweating: no Dizziness/Lightheaded: no Palpitations: no  Carbonated beverages: no N/V/D/C/GAS: no Abdominal Pain: no Dumping syndrome: no  Recent  physical activity:  ADL's  Progress Towards Goal(s):  In Progress Teaching method utilized: Visual & Auditory  Demonstrated degree of understanding via: Teach Back  Readiness Level: Action Barriers to learning/adherence to lifestyle change: none identified  Handouts given during visit include: Phase V diet Progression  Goals Sheet The Benefits of Exercise are endless..... Support Group Topics   Teaching Method Utilized:  Visual Auditory Hands on  Demonstrated degree of understanding via:  Teach Back   Monitoring/Evaluation:  Dietary intake, exercise, and body weight. Follow up in 3 months for 9 month post-op visit.

## 2021-08-10 ENCOUNTER — Other Ambulatory Visit (HOSPITAL_COMMUNITY): Payer: Self-pay

## 2021-08-11 ENCOUNTER — Other Ambulatory Visit (HOSPITAL_COMMUNITY): Payer: Self-pay

## 2021-08-11 MED ORDER — ESCITALOPRAM OXALATE 20 MG PO TABS
20.0000 mg | ORAL_TABLET | Freq: Every day | ORAL | 0 refills | Status: DC
  Filled 2021-08-11: qty 90, 90d supply, fill #0

## 2021-09-18 DIAGNOSIS — F411 Generalized anxiety disorder: Secondary | ICD-10-CM | POA: Diagnosis not present

## 2021-09-18 DIAGNOSIS — Q6589 Other specified congenital deformities of hip: Secondary | ICD-10-CM | POA: Diagnosis not present

## 2021-09-18 DIAGNOSIS — I159 Secondary hypertension, unspecified: Secondary | ICD-10-CM | POA: Diagnosis not present

## 2021-09-18 DIAGNOSIS — M25552 Pain in left hip: Secondary | ICD-10-CM | POA: Diagnosis not present

## 2021-09-23 ENCOUNTER — Other Ambulatory Visit (HOSPITAL_COMMUNITY): Payer: Self-pay

## 2021-10-08 ENCOUNTER — Encounter: Payer: 59 | Attending: General Surgery | Admitting: Skilled Nursing Facility1

## 2021-10-08 DIAGNOSIS — Z713 Dietary counseling and surveillance: Secondary | ICD-10-CM | POA: Insufficient documentation

## 2021-10-08 DIAGNOSIS — E669 Obesity, unspecified: Secondary | ICD-10-CM | POA: Insufficient documentation

## 2021-10-08 DIAGNOSIS — Z6827 Body mass index (BMI) 27.0-27.9, adult: Secondary | ICD-10-CM | POA: Insufficient documentation

## 2021-10-08 DIAGNOSIS — I10 Essential (primary) hypertension: Secondary | ICD-10-CM | POA: Insufficient documentation

## 2021-10-08 DIAGNOSIS — E569 Vitamin deficiency, unspecified: Secondary | ICD-10-CM | POA: Diagnosis not present

## 2021-10-08 DIAGNOSIS — Z9884 Bariatric surgery status: Secondary | ICD-10-CM | POA: Diagnosis not present

## 2021-10-08 NOTE — Progress Notes (Signed)
Follow-up visit:  Post-Operative sleeve Surgery ? ?Medical Nutrition Therapy ? ?Surgery date: 03/04/2021 ?Surgery type: sleeve ?Start weight at [facility]: 259.9 ?Weight today: 183.6 pounds ?  ?Body Composition Scale 03/19/2021 04/29/2021 07/09/2021 10/08/2021  ?Current Body Weight 245 230.6 206.5 183.6  ?Total Body Fat % 41.8 40.1 36.6 32.6  ?Visceral Fat '12 10 9 7  '$ ?Fat-Free Mass % 58.1 59.8 63.3 67.3  ? Total Body Water % 43.5 44.4 46.1 48.1  ?Muscle-Mass lbs 35.4 35.3 35 34.8  ?BMI 37.2 35 31.3 27.8  ?Body Fat Displacement      ?       Torso  lbs 63.4 57.3 46.8 37  ?       Left Leg  lbs 12.6 11.4 9.3 7.4  ?       Right Leg  lbs 12.6 11.4 9.3 7.4  ?       Left Arm  lbs 6.3 5.7 4.6 3.7  ?       Right Arm   lbs 6.3 5.7 4.6 3.7  ? ?Clinical  ?Medical hx: anxiety, depression, GERD, HTN, hyperlipidemia  ?Medications: see list  ?Labs: vitmain D 59.1 ?Notable signs/symptoms: migraines once a month, hip dysplasia, occassionally constipation  ?Any previous deficiencies? No ? ? ?Pt state she has been having acid reflux needing the acid reducer with some break through.  ?Pt states she is getting her fluid in every day it is just tough.  ?Pt states she tracks her foods daily. She recognizes this is an emotionally stressful activity, fearful she will gain wt, but recognizing that she does not want to log for the rest of her life.  When suggested to not focus on logging food, pt was resistant.  Dietitian suggested trying for one week, checking in with emotional status. ?Pt states her goal wt remains 175. ?Pt asked dietitian what a appropriate weight is for her.  Dietitian responded education on appropriate weight for her frame. ?Pt states she has loose skin.  Dietitian educated patient about loose skin as not metabolically active and will add to wt number. ? ?24 hr recall: ? ?Breakfast: coffee + protein shake ?Snack: ?Lunch: salad with protein: cucumber, carrots, egg, chicken or deli ham, cheese or wrap: cheese, 10 pepperoni, cheese  or fajita chicken + cheese   ?Snack: peanuts or quest chips or peanut butter balls ?Dinner: chicken alfredo no pasta + green beans or broccoli or taco salad or pork shoulder over salad or thighs + veggies or [place] patty ?Snack: sometimes greek yogurt ? ?Beverages: water, coffee + protein shake, Gatorade ? ? ?Fluid intake: 64 oz ? ?Medications: See List ?Supplementation: multi and calcium  ? ? ?Using straws: no ?Drinking while eating: no ?Having you been chewing well: yes ?Chewing/swallowing difficulties: no ?Changes in vision: no ?Changes to mood/headaches: no ?Hair loss/Cahnges to skin/Changes to nails: some thinning ?Any difficulty focusing or concentrating: no ?Sweating: no ?Dizziness/Lightheaded: no ?Palpitations: no  ?Carbonated beverages: no ?N/V/D/C/GAS: no ?Abdominal Pain: no ?Dumping syndrome: no ? ?Recent physical activity:  walking 5 days a week 2-3 miles ? ?Progress Towards Goal(s):  In Progress ?Teaching method utilized: Visual & Auditory  ?Demonstrated degree of understanding via: Teach Back  ?Readiness Level: Action ?Barriers to learning/adherence to lifestyle change: none identified ? ?Goals: ?-continue to avoid red sauce ?-aim to not have anything on your stomach 3 hours before bed  ?-get back to drinking alkaline water if needed ?-avoid pepperoni  ?-add in non starchy vegetables with lunch ?-introduce resistance to your week ?-  choose complex carbohydrates   ?-try not tracking/logging intake for one week, checking in with your emotional status. ? ?Teaching Method Utilized:  ?Visual ?Auditory ?Hands on ? ?Demonstrated degree of understanding via:  Teach Back  ? ?Monitoring/Evaluation:  Dietary intake, exercise, and body weight. ? ?Pt to follow-up in early August. ?

## 2021-11-06 ENCOUNTER — Other Ambulatory Visit (HOSPITAL_COMMUNITY): Payer: Self-pay

## 2021-11-06 DIAGNOSIS — Z0000 Encounter for general adult medical examination without abnormal findings: Secondary | ICD-10-CM | POA: Diagnosis not present

## 2021-11-06 DIAGNOSIS — E663 Overweight: Secondary | ICD-10-CM | POA: Diagnosis not present

## 2021-11-06 MED ORDER — ESCITALOPRAM OXALATE 20 MG PO TABS
ORAL_TABLET | ORAL | 3 refills | Status: AC
  Filled 2021-11-06: qty 90, 90d supply, fill #0
  Filled 2022-02-06: qty 90, 90d supply, fill #1
  Filled 2022-03-21 – 2022-05-03 (×2): qty 90, 90d supply, fill #2

## 2021-11-06 MED ORDER — LOSARTAN POTASSIUM 25 MG PO TABS
25.0000 mg | ORAL_TABLET | Freq: Every day | ORAL | 3 refills | Status: AC
  Filled 2022-03-21: qty 90, 90d supply, fill #0

## 2021-11-06 MED ORDER — BUSPIRONE HCL 10 MG PO TABS
ORAL_TABLET | ORAL | 3 refills | Status: AC
  Filled 2021-12-21: qty 180, 90d supply, fill #0
  Filled 2022-03-21: qty 180, 90d supply, fill #1
  Filled 2022-05-03: qty 180, 90d supply, fill #2

## 2021-11-19 DIAGNOSIS — H5213 Myopia, bilateral: Secondary | ICD-10-CM | POA: Diagnosis not present

## 2021-11-19 DIAGNOSIS — H52221 Regular astigmatism, right eye: Secondary | ICD-10-CM | POA: Diagnosis not present

## 2021-12-22 ENCOUNTER — Other Ambulatory Visit (HOSPITAL_COMMUNITY): Payer: Self-pay

## 2022-02-05 ENCOUNTER — Ambulatory Visit: Payer: 59 | Admitting: Skilled Nursing Facility1

## 2022-02-06 ENCOUNTER — Other Ambulatory Visit (HOSPITAL_COMMUNITY): Payer: Self-pay

## 2022-02-09 ENCOUNTER — Encounter: Payer: 59 | Attending: Nurse Practitioner | Admitting: Skilled Nursing Facility1

## 2022-02-09 ENCOUNTER — Encounter: Payer: Self-pay | Admitting: Skilled Nursing Facility1

## 2022-02-09 DIAGNOSIS — E669 Obesity, unspecified: Secondary | ICD-10-CM | POA: Insufficient documentation

## 2022-02-09 NOTE — Progress Notes (Signed)
Follow-up visit:  Post-Operative sleeve Surgery  Medical Nutrition Therapy  Surgery date: 03/04/2021 Surgery type: sleeve Start weight at [facility]: 259.9 Weight today: 159.5 pounds   Body Composition Scale 03/19/2021 04/29/2021 07/09/2021 10/08/2021 02/09/2022  Current Body Weight 245 230.6 206.5 183.6 159.5  Total Body Fat % 41.8 40.1 36.6 32.6 27.1  Visceral Fat '12 10 9 7 5  '$ Fat-Free Mass % 58.1 59.8 63.3 67.3 72.8   Total Body Water % 43.5 44.4 46.1 48.1 50.9  Muscle-Mass lbs 35.4 35.3 35 34.8 34.5  BMI 37.2 35 31.3 27.8 24.1  Body Fat Displacement              Torso  lbs 63.4 57.3 46.8 37 26.7         Left Leg  lbs 12.6 11.4 9.3 7.4 5.3         Right Leg  lbs 12.6 11.4 9.3 7.4 5.3         Left Arm  lbs 6.3 5.7 4.6 3.7 2.6         Right Arm   lbs 6.3 5.7 4.6 3.7 2.6   Clinical  Medical hx: anxiety, depression, GERD, HTN, hyperlipidemia  Medications: see list  Labs: vitmain D 59.1 Notable signs/symptoms: migraines once a month, hip dysplasia, occassionally constipation  Any previous deficiencies? No   Pt states she is happy at her one year and states she is going to do skin removal but does wonder if she wants to be 150 pounds. Pt states Saturday and Sunday she eats throughout her 12 hour shift and does not have a dinner meal.    24 hr recall: 2 days a week she does not have food for breakfast   Breakfast: coffee + protein shake + seeded 10 grams protein with almond butter and banan or low suagr oatmeal with almond butter Snack:  Lunch: egg + cheese + [place] meat + cucumber + humus (once a week) or taziki or deli meat and lettuce and cheese high fiber wrap  Snack: peanuts or quest chips or peanut butter balls Dinner: yogurt or patty + cheese or meatball and sauce and green beans Snack: sometimes greek yogurt  Beverages: water + flavorings, coffee + protein shake, Gatorade   Fluid intake: 64 oz  Medications: See List Supplementation: multi and calcium    Using  straws: no Drinking while eating: no Having you been chewing well: yes Chewing/swallowing difficulties: no Changes in vision: no Changes to mood/headaches: no Hair loss/Cahnges to skin/Changes to nails: some thinning Any difficulty focusing or concentrating: no Sweating: no Dizziness/Lightheaded: no Palpitations: no  Carbonated beverages: no N/V/D/C/GAS: no Abdominal Pain: no Dumping syndrome: no  Recent physical activity:  walking 5 days a week 2-3 miles; 3-4 days a week strength training   Progress Towards Goal(s):  In Progress Teaching method utilized: Visual & Auditory  Demonstrated degree of understanding via: Teach Back  Readiness Level: Action Barriers to learning/adherence to lifestyle change: none identified  Education topics: Why you need complex carbohydrates: Whole grains and other complex carbohydrates are required to have a healthy diet. Whole grains provide fiber which can help with blood glucose levels and help keep you satiated. Fruits and starchy vegetables provide essential vitamins and minerals required for immune function, eyesight support, brain support, bone density, wound healing and many other functions within the body. According to the current evidenced based 2020-2025 Dietary Guidelines for Americans, complex carbohydrates are part of a healthy eating pattern which is associated with a decreased risk  for type 2 diabetes, cancers, and cardiovascular disease.  Encouraged patient to honor their body's internal hunger and fullness cues.  Throughout the day, check in mentally and rate hunger. Stop eating when satisfied not full regardless of how much food is left on the plate.  Get more if still hungry 20-30 minutes later.  The key is to honor satisfaction so throughout the meal, rate fullness factor and stop when comfortably satisfied not physically full. The key is to honor hunger and fullness without any feelings of guilt or shame.  Pay attention to what the  internal cues are, rather than any external factors. This will enhance the confidence you have in listening to your own body and following those internal cues enabling you to increase how often you eat when you are hungry not out of appetite and stop when you are satisfied not full.  Encouraged pt to continue to eat balanced meals inclusive of non starchy vegetables 2 times a day 7 days a week Encouraged pt to choose lean protein sources: limiting beef, pork, sausage, hotdogs, and lunch meat Encourage pt to choose healthy fats such as plant based limiting animal fats Encouraged pt to continue to drink a minium 64 fluid ounces with half being plain water to satisfy proper hydration   Goals: -Ensure you are eating adequate complex carbohydrates, protein and essential fatty acids by eating every 2-3 hours and choosing foods from each category by referring to handouts offered -consume at least 32 ounces of plain water daily  Teaching Method Utilized:  Visual Auditory Hands on  Demonstrated degree of understanding via:  Teach Back   Monitoring/Evaluation:  Dietary intake, exercise, and body weight.  Pt to follow-up in 3 months

## 2022-02-24 DIAGNOSIS — Z130 Encounter for screening for diseases of the blood and blood-forming organs and certain disorders involving the immune mechanism: Secondary | ICD-10-CM | POA: Diagnosis not present

## 2022-02-24 DIAGNOSIS — Z01419 Encounter for gynecological examination (general) (routine) without abnormal findings: Secondary | ICD-10-CM | POA: Diagnosis not present

## 2022-02-24 DIAGNOSIS — Z1389 Encounter for screening for other disorder: Secondary | ICD-10-CM | POA: Diagnosis not present

## 2022-02-24 DIAGNOSIS — Z9884 Bariatric surgery status: Secondary | ICD-10-CM | POA: Diagnosis not present

## 2022-02-25 DIAGNOSIS — Z1231 Encounter for screening mammogram for malignant neoplasm of breast: Secondary | ICD-10-CM | POA: Diagnosis not present

## 2022-03-05 ENCOUNTER — Other Ambulatory Visit (HOSPITAL_BASED_OUTPATIENT_CLINIC_OR_DEPARTMENT_OTHER): Payer: Self-pay

## 2022-03-05 MED ORDER — OXYCODONE-ACETAMINOPHEN 5-325 MG PO TABS
ORAL_TABLET | ORAL | 0 refills | Status: AC
  Filled 2022-03-05: qty 20, 4d supply, fill #0

## 2022-03-05 MED ORDER — HYDROCODONE-ACETAMINOPHEN 5-325 MG PO TABS
ORAL_TABLET | ORAL | 0 refills | Status: AC
  Filled 2022-03-05: qty 30, 5d supply, fill #0

## 2022-03-05 MED ORDER — ONDANSETRON 8 MG PO TBDP
ORAL_TABLET | ORAL | 0 refills | Status: AC
  Filled 2022-03-05: qty 15, 5d supply, fill #0

## 2022-03-05 MED ORDER — DIAZEPAM 5 MG PO TABS
ORAL_TABLET | ORAL | 0 refills | Status: AC
  Filled 2022-03-05: qty 15, 5d supply, fill #0

## 2022-03-06 DIAGNOSIS — L814 Other melanin hyperpigmentation: Secondary | ICD-10-CM | POA: Diagnosis not present

## 2022-03-06 DIAGNOSIS — D225 Melanocytic nevi of trunk: Secondary | ICD-10-CM | POA: Diagnosis not present

## 2022-03-06 DIAGNOSIS — L578 Other skin changes due to chronic exposure to nonionizing radiation: Secondary | ICD-10-CM | POA: Diagnosis not present

## 2022-03-21 ENCOUNTER — Other Ambulatory Visit (HOSPITAL_COMMUNITY): Payer: Self-pay

## 2022-03-23 ENCOUNTER — Other Ambulatory Visit (HOSPITAL_BASED_OUTPATIENT_CLINIC_OR_DEPARTMENT_OTHER): Payer: Self-pay

## 2022-03-23 MED ORDER — SCOPOLAMINE 1 MG/3DAYS TD PT72
MEDICATED_PATCH | TRANSDERMAL | 0 refills | Status: AC
  Filled 2022-03-23: qty 1, 1d supply, fill #0

## 2022-03-27 DIAGNOSIS — D225 Melanocytic nevi of trunk: Secondary | ICD-10-CM | POA: Diagnosis not present

## 2022-03-28 DIAGNOSIS — Z79899 Other long term (current) drug therapy: Secondary | ICD-10-CM | POA: Diagnosis not present

## 2022-03-28 DIAGNOSIS — R42 Dizziness and giddiness: Secondary | ICD-10-CM | POA: Diagnosis not present

## 2022-03-28 DIAGNOSIS — D72829 Elevated white blood cell count, unspecified: Secondary | ICD-10-CM | POA: Diagnosis not present

## 2022-03-28 DIAGNOSIS — I959 Hypotension, unspecified: Secondary | ICD-10-CM | POA: Diagnosis not present

## 2022-03-28 DIAGNOSIS — I951 Orthostatic hypotension: Secondary | ICD-10-CM | POA: Diagnosis not present

## 2022-03-28 DIAGNOSIS — E1165 Type 2 diabetes mellitus with hyperglycemia: Secondary | ICD-10-CM | POA: Diagnosis not present

## 2022-03-28 DIAGNOSIS — R739 Hyperglycemia, unspecified: Secondary | ICD-10-CM | POA: Diagnosis not present

## 2022-03-28 DIAGNOSIS — I10 Essential (primary) hypertension: Secondary | ICD-10-CM | POA: Diagnosis not present

## 2022-04-01 DIAGNOSIS — I10 Essential (primary) hypertension: Secondary | ICD-10-CM | POA: Diagnosis not present

## 2022-04-01 DIAGNOSIS — F418 Other specified anxiety disorders: Secondary | ICD-10-CM | POA: Diagnosis not present

## 2022-04-01 DIAGNOSIS — K449 Diaphragmatic hernia without obstruction or gangrene: Secondary | ICD-10-CM | POA: Diagnosis not present

## 2022-04-01 DIAGNOSIS — N200 Calculus of kidney: Secondary | ICD-10-CM | POA: Diagnosis not present

## 2022-04-01 DIAGNOSIS — Z79899 Other long term (current) drug therapy: Secondary | ICD-10-CM | POA: Diagnosis not present

## 2022-04-01 DIAGNOSIS — D649 Anemia, unspecified: Secondary | ICD-10-CM | POA: Diagnosis not present

## 2022-04-01 DIAGNOSIS — A419 Sepsis, unspecified organism: Secondary | ICD-10-CM | POA: Diagnosis not present

## 2022-04-07 DIAGNOSIS — D649 Anemia, unspecified: Secondary | ICD-10-CM | POA: Diagnosis not present

## 2022-04-07 DIAGNOSIS — M79602 Pain in left arm: Secondary | ICD-10-CM | POA: Diagnosis not present

## 2022-04-24 ENCOUNTER — Other Ambulatory Visit (HOSPITAL_COMMUNITY): Payer: Self-pay

## 2022-04-24 DIAGNOSIS — R5383 Other fatigue: Secondary | ICD-10-CM | POA: Diagnosis not present

## 2022-04-24 DIAGNOSIS — K912 Postsurgical malabsorption, not elsewhere classified: Secondary | ICD-10-CM | POA: Diagnosis not present

## 2022-04-24 DIAGNOSIS — E569 Vitamin deficiency, unspecified: Secondary | ICD-10-CM | POA: Diagnosis not present

## 2022-04-24 DIAGNOSIS — Z9884 Bariatric surgery status: Secondary | ICD-10-CM | POA: Diagnosis not present

## 2022-04-24 MED ORDER — PANTOPRAZOLE SODIUM 40 MG PO TBEC
40.0000 mg | DELAYED_RELEASE_TABLET | Freq: Every day | ORAL | 3 refills | Status: AC
  Filled 2022-04-24 – 2022-05-27 (×3): qty 90, 90d supply, fill #0

## 2022-05-04 ENCOUNTER — Encounter: Payer: Self-pay | Admitting: Skilled Nursing Facility1

## 2022-05-04 ENCOUNTER — Encounter: Payer: 59 | Attending: General Surgery | Admitting: Skilled Nursing Facility1

## 2022-05-04 ENCOUNTER — Other Ambulatory Visit (HOSPITAL_COMMUNITY): Payer: Self-pay

## 2022-05-04 VITALS — Ht 68.0 in | Wt 149.1 lb

## 2022-05-04 DIAGNOSIS — K912 Postsurgical malabsorption, not elsewhere classified: Secondary | ICD-10-CM | POA: Diagnosis not present

## 2022-05-04 DIAGNOSIS — R5383 Other fatigue: Secondary | ICD-10-CM | POA: Diagnosis not present

## 2022-05-04 DIAGNOSIS — E569 Vitamin deficiency, unspecified: Secondary | ICD-10-CM | POA: Diagnosis not present

## 2022-05-04 DIAGNOSIS — E669 Obesity, unspecified: Secondary | ICD-10-CM | POA: Diagnosis not present

## 2022-05-04 DIAGNOSIS — Z9884 Bariatric surgery status: Secondary | ICD-10-CM | POA: Diagnosis not present

## 2022-05-04 NOTE — Progress Notes (Signed)
Follow-up visit:  Post-Operative sleeve Surgery  Medical Nutrition Therapy  Surgery date: 03/04/2021 Surgery type: sleeve Start weight at [facility]: 259.9 Weight today: 149.1 pounds   Body Composition Scale 04/29/2021 07/09/2021 10/08/2021 02/09/2022 05/04/2022  Current Body Weight 230.6 206.5 183.6 159.5 149.1  Total Body Fat % 40.1 36.6 32.6 27.1 24.4  Visceral Fat '10 9 7 5 5  '$ Fat-Free Mass % 59.8 63.3 67.3 72.8 75.5   Total Body Water % 44.4 46.1 48.1 50.9 52.2  Muscle-Mass lbs 35.3 35 34.8 34.5 34.2  BMI 35 31.3 27.8 24.1 22.5  Body Fat Displacement              Torso  lbs 57.3 46.8 37 26.7 22.5         Left Leg  lbs 11.4 9.3 7.4 5.3 4.5         Right Leg  lbs 11.4 9.3 7.4 5.3 4.5         Left Arm  lbs 5.7 4.6 3.7 2.6 2.2         Right Arm   lbs 5.7 4.6 3.7 2.6 2.2   Clinical  Medical hx: anxiety, depression, GERD, HTN, hyperlipidemia  Medications: see list; off blood pressure medication  Labs: vitmain D 59.1 Notable signs/symptoms: migraines once a month, hip dysplasia, occassionally constipation  Any previous deficiencies? No   Pt states she did get a paniculectomy, breast lift, and liposuction and has had a rough recovery and hemoglobin drop from a hematoma. Pt states she is struggling mentally with not being active with worries of gaining with but is working on moving through those feelings without it negatively affecting her dietary choices.     24 hr recall: 2 days a week she does not have food for breakfast   Breakfast 6:30: coffee + protein shake  Snack 9: 100 cal oatmeal + peanut butter Lunch: chicken or steak with lettuce and laughing cow cheese high fiber wrap  Snack:  Dinner: 1 slice veggie pizza + side salad Snack: sometimes greek yogurt  Beverages: water + flavorings, coffee + protein shake, Gatorade   Fluid intake: 64 oz  Medications: See List Supplementation: multi and calcium    Using straws: no Drinking while eating: no Having you been chewing  well: yes Chewing/swallowing difficulties: no Changes in vision: no Changes to mood/headaches: no Hair loss/Cahnges to skin/Changes to nails: some thinning Any difficulty focusing or concentrating: no Sweating: no Dizziness/Lightheaded: no Palpitations: no  Carbonated beverages: no N/V/D/C/GAS: no Abdominal Pain: no Dumping syndrome: no  Recent physical activity:  walking 5 days a week 2-3 miles; 3-4 days a week strength training   Progress Towards Goal(s):  In Progress Teaching method utilized: Visual & Auditory  Demonstrated degree of understanding via: Teach Back  Readiness Level: Action Barriers to learning/adherence to lifestyle change: none identified  Education topics: continued Why you need complex carbohydrates: Whole grains and other complex carbohydrates are required to have a healthy diet. Whole grains provide fiber which can help with blood glucose levels and help keep you satiated. Fruits and starchy vegetables provide essential vitamins and minerals required for immune function, eyesight support, brain support, bone density, wound healing and many other functions within the body. According to the current evidenced based 2020-2025 Dietary Guidelines for Americans, complex carbohydrates are part of a healthy eating pattern which is associated with a decreased risk for type 2 diabetes, cancers, and cardiovascular disease.  Encouraged patient to honor their body's internal hunger and fullness cues.  Throughout  the day, check in mentally and rate hunger. Stop eating when satisfied not full regardless of how much food is left on the plate.  Get more if still hungry 20-30 minutes later.  The key is to honor satisfaction so throughout the meal, rate fullness factor and stop when comfortably satisfied not physically full. The key is to honor hunger and fullness without any feelings of guilt or shame.  Pay attention to what the internal cues are, rather than any external factors. This  will enhance the confidence you have in listening to your own body and following those internal cues enabling you to increase how often you eat when you are hungry not out of appetite and stop when you are satisfied not full.  Encouraged pt to continue to eat balanced meals inclusive of non starchy vegetables 2 times a day 7 days a week Encouraged pt to choose lean protein sources: limiting beef, pork, sausage, hotdogs, and lunch meat Encourage pt to choose healthy fats such as plant based limiting animal fats Encouraged pt to continue to drink a minium 64 fluid ounces with half being plain water to satisfy proper hydration   Goals: continued  -Ensure you are eating adequate complex carbohydrates, protein and essential fatty acids by eating every 2-3 hours and choosing foods from each category by referring to handouts offered -allow your body time to heal, it is okay to not be active right now  Teaching Method Utilized:  Visual Auditory Hands on  Demonstrated degree of understanding via:  Teach Back   Monitoring/Evaluation:  Dietary intake, exercise, and body weight.  Pt to follow-up in 3 months to ensure proper weight maintenance

## 2022-05-25 ENCOUNTER — Other Ambulatory Visit (HOSPITAL_COMMUNITY): Payer: Self-pay

## 2022-05-25 MED ORDER — BUSPIRONE HCL 10 MG PO TABS
10.0000 mg | ORAL_TABLET | Freq: Two times a day (BID) | ORAL | 0 refills | Status: AC | PRN
  Filled 2022-05-25 – 2022-05-27 (×2): qty 180, 90d supply, fill #0

## 2022-05-25 MED ORDER — ESCITALOPRAM OXALATE 20 MG PO TABS: 20.0000 mg | ORAL_TABLET | Freq: Every day | ORAL | 0 refills | Status: AC

## 2022-05-25 MED ORDER — PANTOPRAZOLE SODIUM 40 MG PO TBEC: 40.0000 mg | DELAYED_RELEASE_TABLET | Freq: Every day | ORAL | 2 refills | Status: AC

## 2022-05-27 ENCOUNTER — Other Ambulatory Visit (HOSPITAL_COMMUNITY): Payer: Self-pay

## 2022-08-11 ENCOUNTER — Ambulatory Visit: Payer: 59 | Admitting: Skilled Nursing Facility1

## 2023-02-11 ENCOUNTER — Other Ambulatory Visit (HOSPITAL_BASED_OUTPATIENT_CLINIC_OR_DEPARTMENT_OTHER): Payer: Self-pay

## 2023-03-08 ENCOUNTER — Encounter: Payer: Commercial Managed Care - PPO | Attending: General Surgery | Admitting: Skilled Nursing Facility1

## 2023-03-08 ENCOUNTER — Encounter: Payer: Self-pay | Admitting: Skilled Nursing Facility1

## 2023-03-08 VITALS — Ht 68.0 in | Wt 169.8 lb

## 2023-03-08 DIAGNOSIS — Z713 Dietary counseling and surveillance: Secondary | ICD-10-CM | POA: Insufficient documentation

## 2023-03-08 DIAGNOSIS — E669 Obesity, unspecified: Secondary | ICD-10-CM

## 2023-03-08 NOTE — Progress Notes (Addendum)
Follow-up visit:  Post-Operative sleeve Surgery  Medical Nutrition Therapy: appt time: 8:02   end: 8:32  Surgery date: 03/04/2021 Surgery type: sleeve Start weight at [facility]: 259.9 Weight today: 169.8 pounds   Body Composition Scale 07/09/2021 10/08/2021 02/09/2022 05/04/2022 03/08/2023  Current Body Weight 206.5 183.6 159.5 149.1 169.8  Total Body Fat % 36.6 32.6 27.1 24.4 30.8  Visceral Fat 9 7 5 5 6   Fat-Free Mass % 63.3 67.3 72.8 75.5 69.1   Total Body Water % 46.1 48.1 50.9 52.2 49  Muscle-Mass lbs 35 34.8 34.5 34.2 33.6  BMI 31.3 27.8 24.1 22.5 25.7  Body Fat Displacement              Torso  lbs 46.8 37 26.7 22.5 32.3         Left Leg  lbs 9.3 7.4 5.3 4.5 6.4         Right Leg  lbs 9.3 7.4 5.3 4.5 6.4         Left Arm  lbs 4.6 3.7 2.6 2.2 3.2         Right Arm   lbs 4.6 3.7 2.6 2.2 3.2   Clinical  Medical hx: anxiety, depression, GERD, HTN, hyperlipidemia  Medications: see list; off blood pressure medication  Labs:  Notable signs/symptoms: migraines once a month, hip dysplasia, occassionally constipation  Any previous deficiencies? 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    24 hr recall:   Breakfast 6:30: coffee + protein shake  Snack 9: 100 cal oatmeal + peanut butter Lunch: chicken or steak with lettuce or mayo based chicken salad + crackers or chicken and lettuce and tomato wrap + greek dressing + laughing cow  Snack:  Dinner: 1 slice veggie pizza + side salad Snack: sometimes greek yogurt  Beverages: water + flavorings, coffee + protein shake, Gatorade   Fluid intake: 64 oz  Medications: See List Supplementation: multi and calcium    Using straws: no Drinking while eating: no Having you been chewing well: yes Chewing/swallowing difficulties: no Changes in vision: no Changes to mood/headaches: no Hair loss/Cahnges to  skin/Changes to nails: some thinning Any difficulty focusing or concentrating: no Sweating: no Dizziness/Lightheaded: no Palpitations: no  Carbonated beverages: no N/V/D/C/GAS: no Abdominal Pain: no Dumping syndrome: no  Recent physical activity:  walking 5 days a week 2-3 miles  Progress Towards Goal(s):  In Progress Teaching method utilized: Visual & Auditory  Demonstrated degree of understanding via: Teach Back  Readiness Level: Action Barriers to learning/adherence to lifestyle change: none identified  Education topics: continued Why you need complex carbohydrates: Whole grains and other complex carbohydrates are required to have a healthy diet. Whole grains provide fiber which can help with blood glucose levels and help keep you satiated. Fruits and starchy vegetables provide essential vitamins and minerals required for immune function, eyesight support, brain support, bone density, wound healing and many other functions within the body. According to the current evidenced based 2020-2025 Dietary Guidelines for Americans, complex carbohydrates are part of a healthy eating pattern which is associated with a decreased risk for type 2 diabetes, cancers, and cardiovascular disease.  Encouraged patient to honor their body's internal hunger and fullness cues.  Throughout the day, check in mentally and rate hunger. Stop eating when satisfied not full regardless of how much food is left on  the plate.  Get more if still hungry 20-30 minutes later.  The key is to honor satisfaction so throughout the meal, rate fullness factor and stop when comfortably satisfied not physically full. The key is to honor hunger and fullness without any feelings of guilt or shame.  Pay attention to what the internal cues are, rather than any external factors. This will enhance the confidence you have in listening to your own body and following those internal cues enabling you to increase how often you eat when you are  hungry not out of appetite and stop when you are satisfied not full.  Encouraged pt to continue to eat balanced meals inclusive of non starchy vegetables 2 times a day 7 days a week Encouraged pt to choose lean protein sources: limiting beef, pork, sausage, hotdogs, and lunch meat Encourage pt to choose healthy fats such as plant based limiting animal fats Encouraged pt to continue to drink a minium 64 fluid ounces with half being plain water to satisfy proper hydration    Teaching Method Utilized:  Visual Auditory Hands on  Demonstrated degree of understanding via:  Teach Back   Monitoring/Evaluation:  Dietary intake, exercise, and body weight.  Pt to follow-up in 6 months

## 2023-04-22 IMAGING — DX DG CHEST 2V
2 series · 2 of 2 positions shown · non-contrast
Comparison: None.

CLINICAL DATA: Obesity.  Preop gastric sleeve.

EXAM:
CHEST - 2 VIEW

[chest pa]
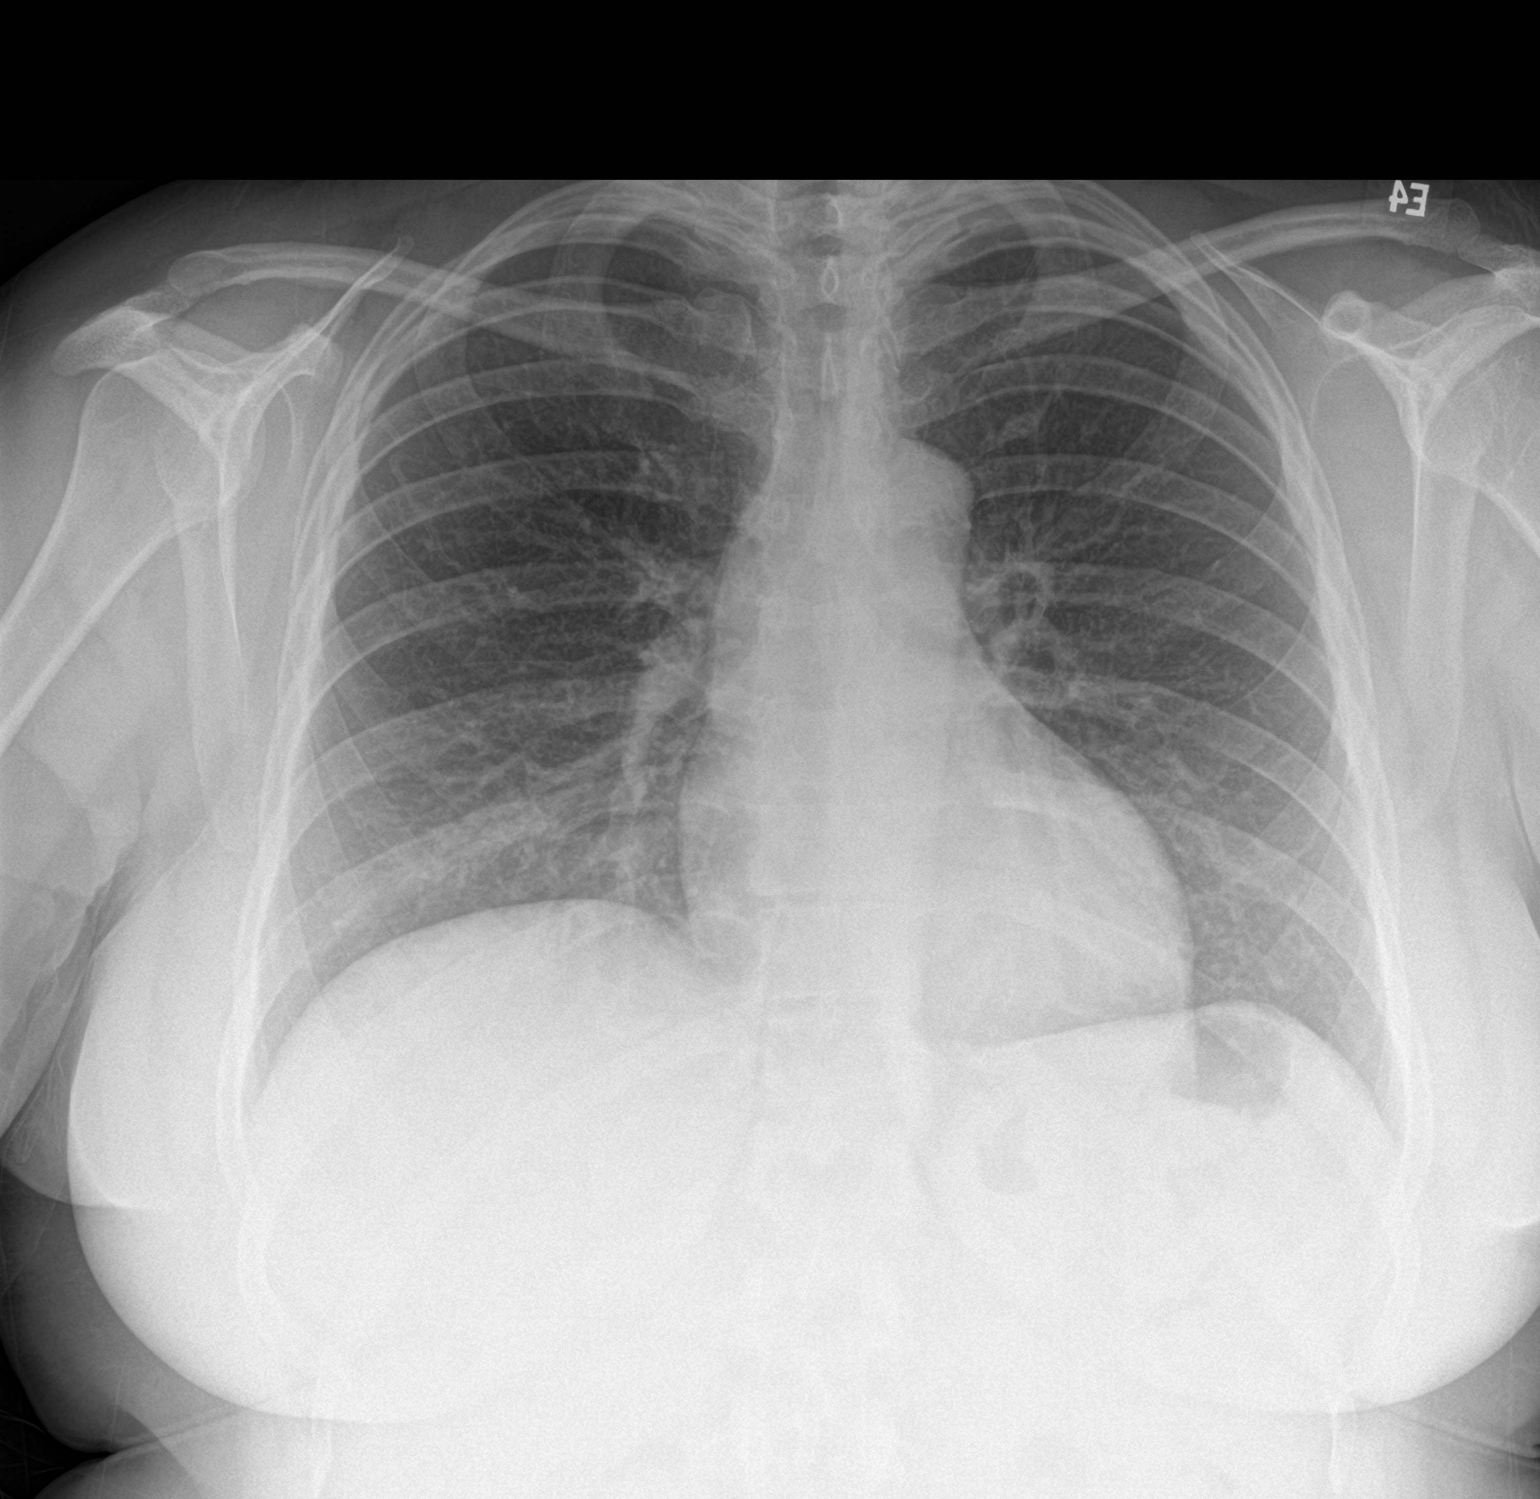

[chest lat]
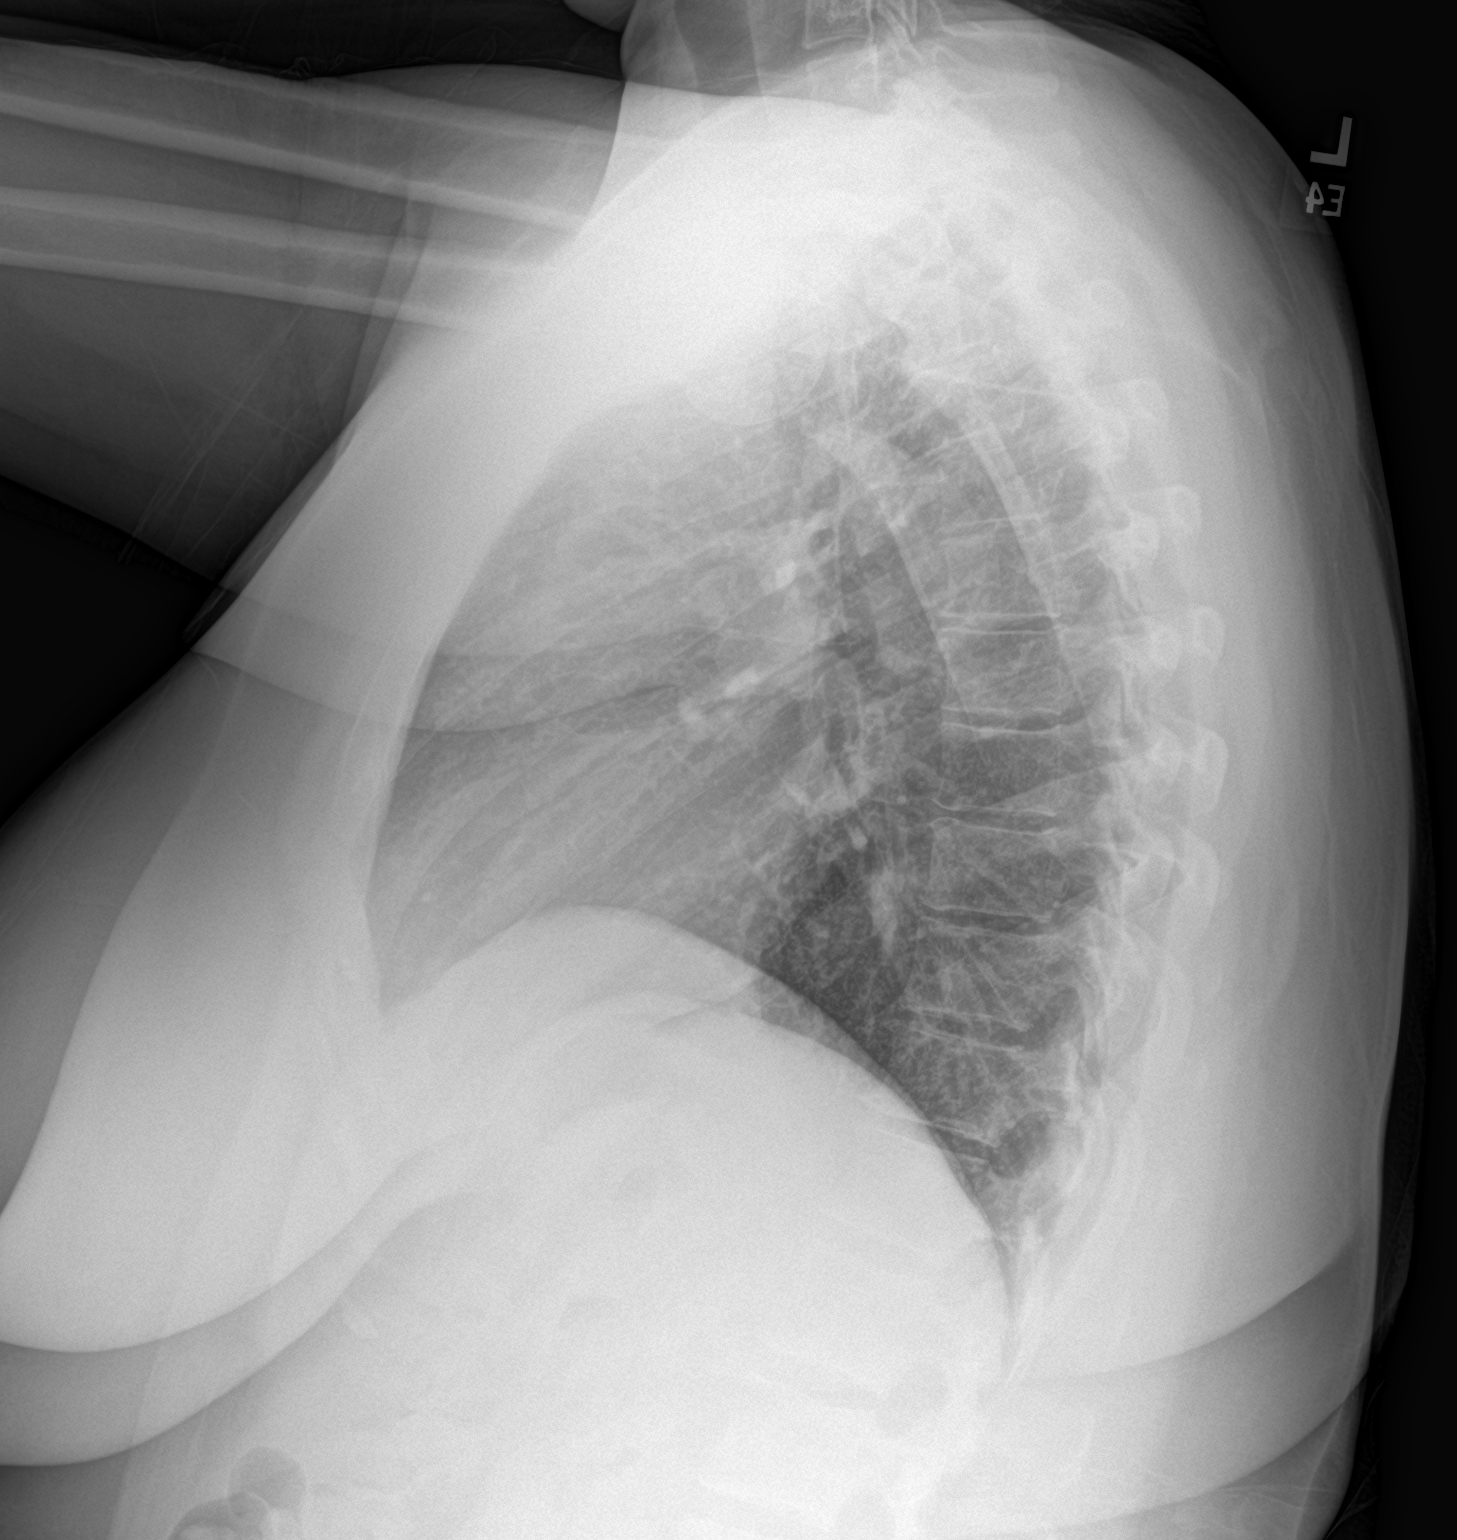

[2 of 2 positions shown; findings below may reference images not displayed]

FINDINGS: The cardiomediastinal contours are normal. The lungs are clear.
Pulmonary vasculature is normal. No consolidation, pleural effusion,
or pneumothorax. No acute osseous abnormalities are seen.
IMPRESSION: Negative radiographs of the chest.

## 2023-07-06 ENCOUNTER — Other Ambulatory Visit (HOSPITAL_COMMUNITY): Payer: Self-pay

## 2023-07-06 MED ORDER — BUSPIRONE HCL 10 MG PO TABS
10.0000 mg | ORAL_TABLET | Freq: Two times a day (BID) | ORAL | 0 refills | Status: AC | PRN
  Filled 2023-07-06: qty 60, 30d supply, fill #0

## 2023-07-07 ENCOUNTER — Encounter (HOSPITAL_BASED_OUTPATIENT_CLINIC_OR_DEPARTMENT_OTHER): Payer: Self-pay

## 2023-07-07 ENCOUNTER — Other Ambulatory Visit (HOSPITAL_BASED_OUTPATIENT_CLINIC_OR_DEPARTMENT_OTHER): Payer: Self-pay

## 2023-07-08 ENCOUNTER — Other Ambulatory Visit (HOSPITAL_COMMUNITY): Payer: Self-pay

## 2023-07-09 ENCOUNTER — Other Ambulatory Visit: Payer: Self-pay

## 2023-09-02 ENCOUNTER — Ambulatory Visit: Payer: Commercial Managed Care - PPO | Admitting: Skilled Nursing Facility1

## 2023-10-01 ENCOUNTER — Encounter (HOSPITAL_COMMUNITY): Payer: Self-pay | Admitting: *Deleted
